# Patient Record
Sex: Female | Born: 1995 | Race: White | Hispanic: No | Marital: Married | Smoking: Never smoker
Health system: Southern US, Community
[De-identification: ages and names within clinical notes are randomized; demographics above are authoritative.]

## PROBLEM LIST (undated history)

## (undated) ENCOUNTER — Inpatient Hospital Stay: Payer: Self-pay

## (undated) ENCOUNTER — Inpatient Hospital Stay: Admission: RE | Payer: Self-pay | Source: Home / Self Care

## (undated) DIAGNOSIS — L709 Acne, unspecified: Secondary | ICD-10-CM

## (undated) DIAGNOSIS — O1490 Unspecified pre-eclampsia, unspecified trimester: Secondary | ICD-10-CM

## (undated) DIAGNOSIS — J309 Allergic rhinitis, unspecified: Secondary | ICD-10-CM

## (undated) DIAGNOSIS — I639 Cerebral infarction, unspecified: Secondary | ICD-10-CM

## (undated) HISTORY — PX: NO PAST SURGERIES: SHX2092

---

## 2018-01-12 NOTE — L&D Delivery Note (Signed)
Delivery Note  Date of delivery: 12/29/2018 Estimated Date of Delivery: 01/15/19 Patient's last menstrual period was 03/29/2018. EGA: [redacted]w[redacted]d  First Stage: Induction/augmentation : misoprostol, pitocin, foley balloon, AROM Analgesia Lorriane Shire intrapartum: IVPM, epidural AROM at [place] presented to L&D with preeclampsia without severe features. She was induced and augmented with misoprostol, pitocin, foley balloon, and AROM. Epidural placed.   Second Stage: Complete dilation at 1957 Onset of pushing at 2008 FHR second stage: category II Delivery at 2038 on 12/29/2018  She progressed to complete and had a spontaneous vaginal birth of a live female over an intact perineum. The fetal head was delivered in direct OA position with restitution to ROA. No nuchal cord. Anterior then posterior shoulders delivered spontaneously. Baby placed on mom's abdomen and attended to by transition RN. Cord clamped and cut when pulseless by father of the baby.   Third Stage: Placenta delivered spontaneously intact with 3VC at 2048 Placenta disposition: routine disposal Uterine tone firm / bleeding brisk from laceration IV pitocin given for hemorrhage prophylaxis  2nd degree perineal and left labial lacerations identified  Anesthesia for repair: epidural Repair: 3-0 Vicryl Rapide CT Est. Blood Loss (mL): 829  Complications: none  Mom to postpartum.  Baby to Couplet care / Skin to Skin.  Newborn: Birth Weight: 5 lb 8.9 oz (2520 g)  Apgar Scores: 9, 9 Feeding planned: breast   Lisette Grinder, CNM 12/29/2018 9:51 PM

## 2018-06-14 LAB — OB RESULTS CONSOLE RUBELLA ANTIBODY, IGM: Rubella: IMMUNE

## 2018-06-18 ENCOUNTER — Encounter: Payer: Self-pay | Admitting: Emergency Medicine

## 2018-06-18 ENCOUNTER — Emergency Department
Admission: EM | Admit: 2018-06-18 | Discharge: 2018-06-19 | Disposition: A | Discharge status: Home / Self Care | Payer: BC Managed Care – PPO | Attending: Emergency Medicine | Admitting: Emergency Medicine

## 2018-06-18 ENCOUNTER — Other Ambulatory Visit: Payer: Self-pay

## 2018-06-18 DIAGNOSIS — R7401 Elevation of levels of liver transaminase levels: Secondary | ICD-10-CM

## 2018-06-18 DIAGNOSIS — R740 Nonspecific elevation of levels of transaminase and lactic acid dehydrogenase [LDH]: Secondary | ICD-10-CM | POA: Diagnosis not present

## 2018-06-18 DIAGNOSIS — K828 Other specified diseases of gallbladder: Secondary | ICD-10-CM

## 2018-06-18 DIAGNOSIS — O219 Vomiting of pregnancy, unspecified: Secondary | ICD-10-CM

## 2018-06-18 DIAGNOSIS — O26611 Liver and biliary tract disorders in pregnancy, first trimester: Secondary | ICD-10-CM | POA: Insufficient documentation

## 2018-06-18 DIAGNOSIS — Z3A09 9 weeks gestation of pregnancy: Secondary | ICD-10-CM | POA: Insufficient documentation

## 2018-06-18 LAB — URINALYSIS, COMPLETE (UACMP) WITH MICROSCOPIC
Bilirubin Urine: NEGATIVE
Glucose, UA: NEGATIVE mg/dL
Hgb urine dipstick: NEGATIVE
Ketones, ur: 80 mg/dL — AB
Nitrite: NEGATIVE
Protein, ur: 30 mg/dL — AB
Specific Gravity, Urine: 1.023 (ref 1.005–1.030)
pH: 7 (ref 5.0–8.0)

## 2018-06-18 LAB — COMPREHENSIVE METABOLIC PANEL
ALT: 135 U/L — ABNORMAL HIGH (ref 0–44)
AST: 75 U/L — ABNORMAL HIGH (ref 15–41)
Albumin: 4.2 g/dL (ref 3.5–5.0)
Alkaline Phosphatase: 58 U/L (ref 38–126)
Anion gap: 11 (ref 5–15)
BUN: 8 mg/dL (ref 6–20)
CO2: 20 mmol/L — ABNORMAL LOW (ref 22–32)
Calcium: 9.1 mg/dL (ref 8.9–10.3)
Chloride: 104 mmol/L (ref 98–111)
Creatinine, Ser: 0.58 mg/dL (ref 0.44–1.00)
GFR calc Af Amer: >60 mL/min (ref >60)
GFR calc non Af Amer: >60 mL/min (ref >60)
Glucose, Bld: 99 mg/dL (ref 70–99)
Potassium: 3.2 mmol/L — ABNORMAL LOW (ref 3.5–5.1)
Sodium: 135 mmol/L (ref 135–145)
Total Bilirubin: 1.7 mg/dL — ABNORMAL HIGH (ref 0.3–1.2)
Total Protein: 8 g/dL (ref 6.5–8.1)

## 2018-06-18 LAB — CBC
HCT: 35.8 % — ABNORMAL LOW (ref 36.0–46.0)
Hemoglobin: 13.4 g/dL (ref 12.0–15.0)
MCH: 29.9 pg (ref 26.0–34.0)
MCHC: 37.4 g/dL — ABNORMAL HIGH (ref 30.0–36.0)
MCV: 79.9 fL — ABNORMAL LOW (ref 80.0–100.0)
Platelets: 299 10*3/uL (ref 150–400)
RBC: 4.48 MIL/uL (ref 3.87–5.11)
RDW: 11.8 % (ref 11.5–15.5)
WBC: 9 10*3/uL (ref 4.0–10.5)
nRBC: 0 % (ref 0.0–0.2)

## 2018-06-18 LAB — LIPASE, BLOOD: Lipase: 55 U/L — ABNORMAL HIGH (ref 11–51)

## 2018-06-18 MED ORDER — SODIUM CHLORIDE 0.9% FLUSH: 3.0000 mL | Freq: Once | INTRAVENOUS | Status: DC

## 2018-06-18 MED ORDER — METOCLOPRAMIDE HCL 5 MG/ML IJ SOLN
10.0000 mg | Freq: Once | INTRAMUSCULAR | Status: AC
  Administered 2018-06-19: 10 mg via INTRAVENOUS
  Filled 2018-06-18: qty 2

## 2018-06-18 MED ORDER — DEXTROSE-NACL 5-0.9 % IV SOLN
INTRAVENOUS | Status: AC
  Administered 2018-06-19: 01:00:00 via INTRAVENOUS

## 2018-06-18 NOTE — ED Notes (Signed)
Patient states that she is feeling worse. Vital signs checked and stable at this time. Patient given update on wait time. Patient verbalizes understanding.

## 2018-06-18 NOTE — ED Provider Notes (Signed)
[facility] Emergency Department Provider Note  ____________________________________________  Time seen: Approximately 11:58 PM  I have reviewed the triage vital signs and the nursing notes.   HISTORY  Chief Complaint Emesis During Pregnancy   HPI Gabriela Davidson is a 23 y.o. female G65, P1 currently at 10.[redacted] weeks gestational age who presents for evaluation of nausea and vomiting.  Patient has had symptoms for 2 days.  More than 10 episodes daily of nonbloody nonbilious emesis.  Unable to keep anything down.  She has been trying promethazine topically to her wrists every 4 hours with no significant help with.  She has had an ultrasound confirming this pregnancy already with her OB/GYN.  She has no vaginal bleeding, no vaginal discharge, no dysuria, no diarrhea, no abdominal pain, no fever, no cough, no chest pain, no shortness of breath.  PMH None - reviewed  Allergies Patient has no known allergies.  FH Arthritis Father    High blood pressure (Hypertension) Father    Hyperlipidemia (Elevated cholesterol) Father    Lung cancer Maternal Grandfather    Breast cancer Maternal Grandmother      Social History Social History   Tobacco Use  . Smoking status: Never Smoker  . Smokeless tobacco: Never Used  Substance Use Topics  . Alcohol use: Not on file  . Drug use: Not on file    Review of Systems  Constitutional: Negative for fever. Eyes: Negative for visual changes. ENT: Negative for sore throat. Neck: No neck pain  Cardiovascular: Negative for chest pain. Respiratory: Negative for shortness of breath. Gastrointestinal: Negative for abdominal pain or diarrhea. + N/V Genitourinary: Negative for dysuria. Musculoskeletal: Negative for back pain. Skin: Negative for rash. Neurological: Negative for headaches, weakness or numbness. Psych: No SI or HI  ____________________________________________   PHYSICAL EXAM:  VITAL SIGNS: ED  Triage Vitals  Enc Vitals Group     BP 06/18/18 1816 125/81     Pulse Rate 06/18/18 1816 92     Resp 06/18/18 1816 16     Temp 06/18/18 1816 99.2 F (37.3 C)     Temp Source 06/18/18 1816 Oral     SpO2 06/18/18 1816 99 %     Weight 06/18/18 1813 138 lb (62.6 kg)     Height --      Head Circumference --      Peak Flow --      Pain Score 06/18/18 1813 0     Pain Loc --      Pain Edu? --      Excl. in [place]? --     Constitutional: Alert and oriented. Well appearing and in no apparent distress. HEENT:      Head: Normocephalic and atraumatic.         Eyes: Conjunctivae are normal. Sclera is non-icteric.       Mouth/Throat: Mucous membranes are moist.       Neck: Supple with no signs of meningismus. Cardiovascular: Regular rate and rhythm. No murmurs, gallops, or rubs. 2+ symmetrical distal pulses are present in all extremities. No JVD. Respiratory: Normal respiratory effort. Lungs are clear to auscultation bilaterally. No wheezes, crackles, or rhonchi.  Gastrointestinal: Soft, non tender, and non distended with positive bowel sounds. No rebound or guarding. Musculoskeletal: Nontender with normal range of motion in all extremities. No edema, cyanosis, or erythema of extremities. Neurologic: Normal speech and language. Face is symmetric. Moving all extremities. No gross focal neurologic deficits are appreciated. Skin: Skin is warm, dry and  intact. No rash noted. Psychiatric: Mood and affect are normal. Speech and behavior are normal.  ____________________________________________   LABS (all labs ordered are listed, but only abnormal results are displayed)  Labs Reviewed  LIPASE, BLOOD - Abnormal; Notable for the following components:      Result Value   Lipase 55 (*)    All other components within normal limits  COMPREHENSIVE METABOLIC PANEL - Abnormal; Notable for the following components:   Potassium 3.2 (*)    CO2 20 (*)    AST 75 (*)    ALT 135 (*)    Total Bilirubin 1.7 (*)     All other components within normal limits  CBC - Abnormal; Notable for the following components:   HCT 35.8 (*)    MCV 79.9 (*)    MCHC 37.4 (*)    All other components within normal limits  URINALYSIS, COMPLETE (UACMP) WITH MICROSCOPIC - Abnormal; Notable for the following components:   Color, Urine AMBER (*)    APPearance CLOUDY (*)    Ketones, ur 80 (*)    Protein, ur 30 (*)    Leukocytes,Ua SMALL (*)    Bacteria, UA RARE (*)    All other components within normal limits  HCG, QUANTITATIVE, PREGNANCY - Abnormal; Notable for the following components:   hCG, Beta Chain, Quant, S 171,175 (*)    All other components within normal limits  URINE CULTURE   ____________________________________________  EKG  none  ____________________________________________  RADIOLOGY  I have personally reviewed the images performed during this visit and I agree with the Radiologist's read.   Interpretation by Radiologist:  [place] Abdomen Limited Ruq  Result Date: 06/19/2018 CLINICAL DATA:  23 year old female with elevated liver and pancreatic enzymes. EXAM: ULTRASOUND ABDOMEN LIMITED RIGHT UPPER QUADRANT COMPARISON:  None. FINDINGS: Gallbladder: There is a large amount of sludge within the gallbladder. No gallbladder wall thickening or pericholecystic fluid. Negative sonographic Murphy's sign. Common bile duct: Diameter: 3 mm Liver: No focal lesion identified. Within normal limits in parenchymal echogenicity. Portal vein is patent on color Doppler imaging with normal direction of blood flow towards the liver. IMPRESSION: Gallbladder sludge.  No sonographic evidence of acute cholecystitis. Electronically Signed   By: Elgie CollardArash  Radparvar M.D.   On: 06/19/2018 00:57      ____________________________________________   PROCEDURES  Procedure(s) performed: None Procedures Critical Care performed:  None ____________________________________________   INITIAL IMPRESSION / ASSESSMENT AND PLAN / ED COURSE   23 y.o. female G0, P1 currently at 309.[redacted] weeks gestational age who presents for evaluation of nausea and vomiting x 2 days.  Patient is well-appearing in no distress with normal vital signs, abdomen is soft with no tenderness throughout.  Labs showed mildly elevated LFTs, lipase, T bili.  Review of labs done 2 weeks ago showed mildly elevated LFTs.  Patient has no abdominal pain or tenderness on exam.  We will do a right upper quadrant ultrasound to rule out cholelithiasis/cholecystitis.  No significant signs of dehydration however patient does have 80 ketones in her urine.  Will give D5 normal bolus of 1 L and Reglan for nausea.     _________________________ 1:50 AM on 06/19/2018 -----------------------------------------  Ultrasound showing sludge with no evidence of cholecystitis.  After receiving fluids and Reglan patient feels great, tolerating p.o. with no further episodes of vomiting.  Discussed findings of ultrasound with patient and recommended follow-up with surgery as an outpatient.  Patient was given a prescription for Reglan for nausea and vomiting and recommended close  follow-up with her OB/GYN.  Discussed return precautions for abdominal pain or fever.   As part of my medical decision making, I reviewed the following data within the electronic MEDICAL RECORD NUMBER Nursing notes reviewed and incorporated, Labs reviewed , Old chart reviewed, Radiograph reviewed , Notes from prior ED visits and [place] Controlled Substance Database    Pertinent labs & imaging results that were available during my care of the patient were reviewed by me and considered in my medical decision making (see chart for details).    ____________________________________________   FINAL CLINICAL IMPRESSION(S) / ED DIAGNOSES  Final diagnoses:  Transaminitis  Gallbladder sludge  Nausea and vomiting during pregnancy      NEW MEDICATIONS STARTED DURING THIS VISIT:  ED Discharge Orders         Ordered     metoCLOPramide (REGLAN) 10 MG tablet  Every 8 hours PRN     06/19/18 0150           Note:  This document was prepared using Dragon voice recognition software and may include unintentional dictation errors.    Don PerkingVeronese, [place], MD 06/19/18 [phone]

## 2018-06-18 NOTE — ED Triage Notes (Addendum)
Arrives with c/o emesis x 2 days.  Symptoms worsening over past two days.  Patient is 9.[redacted] weeks pregnant.  EDC:  01/15/2019.  G:1 P: 0.  Patient has been taking 10 prometh 25mg /0.4 ml topically to wrist Q 4 hours.  Has helped with nausea.  Patient is AAOx3.  Skin warm and dry. NAD

## 2018-06-19 ENCOUNTER — Emergency Department: Payer: BC Managed Care – PPO

## 2018-06-19 LAB — HCG, QUANTITATIVE, PREGNANCY: hCG, Beta Chain, Quant, S: 171175 m[IU]/mL — ABNORMAL HIGH (ref <5)

## 2018-06-19 MED ORDER — METOCLOPRAMIDE HCL 10 MG PO TABS: 10.0000 mg | ORAL_TABLET | Freq: Three times a day (TID) | ORAL | 0 refills | Status: DC | PRN

## 2018-06-19 NOTE — ED Notes (Signed)
Peripheral IV discontinued. Catheter intact. No signs of infiltration or redness. Gauze applied to IV site.  Discharge instructions reviewed with patient. Questions fielded by this RN. Patient verbalizes understanding of instructions. Patient discharged home in stable condition per veronese. No acute distress noted at time of discharge.   

## 2018-06-19 NOTE — ED Notes (Signed)
Fluids orders not available, pharmacy called, pt already assessed by Dr V, pt c/o 9 weeks preg and continuous N/V, vomiting  More than 10 today

## 2018-06-19 NOTE — ED Notes (Signed)
Patient transported to Ultrasound 

## 2018-06-21 LAB — OB RESULTS CONSOLE GC/CHLAMYDIA
Chlamydia: NEGATIVE
Gonorrhea: NEGATIVE

## 2018-06-21 LAB — URINE CULTURE: Culture: >=100000 — AB

## 2018-06-21 LAB — OB RESULTS CONSOLE HEPATITIS B SURFACE ANTIGEN: Hepatitis B Surface Ag: NEGATIVE

## 2018-06-21 LAB — OB RESULTS CONSOLE VARICELLA ZOSTER ANTIBODY, IGG: Varicella: IMMUNE

## 2018-07-15 DIAGNOSIS — O210 Mild hyperemesis gravidarum: Secondary | ICD-10-CM | POA: Insufficient documentation

## 2018-07-18 ENCOUNTER — Emergency Department
Admission: EM | Admit: 2018-07-18 | Discharge: 2018-07-18 | Discharge status: Against Medical Advice | Payer: BC Managed Care – PPO

## 2018-12-21 LAB — OB RESULTS CONSOLE GBS: GBS: NEGATIVE

## 2018-12-28 ENCOUNTER — Encounter: Payer: Self-pay | Admitting: *Deleted

## 2018-12-28 ENCOUNTER — Other Ambulatory Visit: Payer: Self-pay

## 2018-12-28 ENCOUNTER — Inpatient Hospital Stay
Admission: EM | Admit: 2018-12-28 | Discharge: 2018-12-31 | DRG: 806 | Disposition: A | Discharge status: Home / Self Care | Payer: BC Managed Care – PPO | Attending: Certified Nurse Midwife | Admitting: Certified Nurse Midwife

## 2018-12-28 DIAGNOSIS — O1404 Mild to moderate pre-eclampsia, complicating childbirth: Principal | ICD-10-CM | POA: Diagnosis present

## 2018-12-28 DIAGNOSIS — Z20828 Contact with and (suspected) exposure to other viral communicable diseases: Secondary | ICD-10-CM | POA: Diagnosis present

## 2018-12-28 DIAGNOSIS — O133 Gestational [pregnancy-induced] hypertension without significant proteinuria, third trimester: Secondary | ICD-10-CM | POA: Diagnosis present

## 2018-12-28 DIAGNOSIS — Z3A37 37 weeks gestation of pregnancy: Secondary | ICD-10-CM | POA: Diagnosis not present

## 2018-12-28 DIAGNOSIS — O9081 Anemia of the puerperium: Secondary | ICD-10-CM | POA: Diagnosis not present

## 2018-12-28 DIAGNOSIS — D62 Acute posthemorrhagic anemia: Secondary | ICD-10-CM | POA: Diagnosis not present

## 2018-12-28 DIAGNOSIS — Z23 Encounter for immunization: Secondary | ICD-10-CM | POA: Diagnosis not present

## 2018-12-28 DIAGNOSIS — O368130 Decreased fetal movements, third trimester, not applicable or unspecified: Secondary | ICD-10-CM | POA: Diagnosis present

## 2018-12-28 HISTORY — DX: Gestational (pregnancy-induced) hypertension without significant proteinuria, third trimester: O13.3

## 2018-12-28 LAB — COMPREHENSIVE METABOLIC PANEL
ALT: 12 U/L (ref 0–44)
AST: 20 U/L (ref 15–41)
Albumin: 2.9 g/dL — ABNORMAL LOW (ref 3.5–5.0)
Alkaline Phosphatase: 120 U/L (ref 38–126)
Anion gap: 8 (ref 5–15)
BUN: 9 mg/dL (ref 6–20)
CO2: 22 mmol/L (ref 22–32)
Calcium: 8.9 mg/dL (ref 8.9–10.3)
Chloride: 108 mmol/L (ref 98–111)
Creatinine, Ser: 0.61 mg/dL (ref 0.44–1.00)
GFR calc Af Amer: >60 mL/min (ref >60)
GFR calc non Af Amer: >60 mL/min (ref >60)
Glucose, Bld: 83 mg/dL (ref 70–99)
Potassium: 4.4 mmol/L (ref 3.5–5.1)
Sodium: 138 mmol/L (ref 135–145)
Total Bilirubin: 0.4 mg/dL (ref 0.3–1.2)
Total Protein: 6.7 g/dL (ref 6.5–8.1)

## 2018-12-28 LAB — CBC
HCT: 31.9 % — ABNORMAL LOW (ref 36.0–46.0)
Hemoglobin: 11 g/dL — ABNORMAL LOW (ref 12.0–15.0)
MCH: 30 pg (ref 26.0–34.0)
MCHC: 34.5 g/dL (ref 30.0–36.0)
MCV: 86.9 fL (ref 80.0–100.0)
Platelets: 273 10*3/uL (ref 150–400)
RBC: 3.67 MIL/uL — ABNORMAL LOW (ref 3.87–5.11)
RDW: 13.2 % (ref 11.5–15.5)
WBC: 11.1 10*3/uL — ABNORMAL HIGH (ref 4.0–10.5)
nRBC: 0 % (ref 0.0–0.2)

## 2018-12-28 LAB — RAPID HIV SCREEN (HIV 1/2 AB+AG)
HIV 1/2 Antibodies: NONREACTIVE
HIV-1 P24 Antigen - HIV24: NONREACTIVE

## 2018-12-28 LAB — RESPIRATORY PANEL BY RT PCR (FLU A&B, COVID)
Influenza A by PCR: NEGATIVE
Influenza B by PCR: NEGATIVE
SARS Coronavirus 2 by RT PCR: NEGATIVE

## 2018-12-28 LAB — TYPE AND SCREEN
ABO/RH(D): B POS
Antibody Screen: NEGATIVE

## 2018-12-28 LAB — PROTEIN / CREATININE RATIO, URINE
Creatinine, Urine: 35 mg/dL
Protein Creatinine Ratio: 0.51 mg/mg{Cre} — ABNORMAL HIGH (ref 0.00–0.15)
Total Protein, Urine: 18 mg/dL

## 2018-12-28 LAB — ABO/RH: ABO/RH(D): B POS

## 2018-12-28 LAB — OB RESULTS CONSOLE HIV ANTIBODY (ROUTINE TESTING): HIV: NONREACTIVE

## 2018-12-28 MED ORDER — TERBUTALINE SULFATE 1 MG/ML IJ SOLN: 0.2500 mg | Freq: Once | INTRAMUSCULAR | Status: DC | PRN

## 2018-12-28 MED ORDER — MISOPROSTOL 25 MCG QUARTER TABLET
25.0000 ug | ORAL_TABLET | ORAL | Status: DC | PRN
  Filled 2018-12-28: qty 1

## 2018-12-28 MED ORDER — BUTORPHANOL TARTRATE 1 MG/ML IJ SOLN
1.0000 mg | INTRAMUSCULAR | Status: DC | PRN
  Administered 2018-12-29 (×2): 1 mg via INTRAVENOUS
  Filled 2018-12-28 (×2): qty 1

## 2018-12-28 MED ORDER — MISOPROSTOL 25 MCG QUARTER TABLET
25.0000 ug | ORAL_TABLET | Freq: Once | ORAL | Status: AC
  Administered 2018-12-28: 25 ug via BUCCAL
  Filled 2018-12-28: qty 1

## 2018-12-28 MED ORDER — LACTATED RINGERS IV SOLN
500.0000 mL | INTRAVENOUS | Status: DC | PRN
  Administered 2018-12-29: 250 mL via INTRAVENOUS

## 2018-12-28 MED ORDER — HYDRALAZINE HCL 20 MG/ML IJ SOLN: 10.0000 mg | INTRAMUSCULAR | Status: DC | PRN

## 2018-12-28 MED ORDER — OXYTOCIN BOLUS FROM INFUSION
500.0000 mL | Freq: Once | INTRAVENOUS | Status: AC
  Administered 2018-12-29: 500 mL via INTRAVENOUS

## 2018-12-28 MED ORDER — OXYTOCIN 40 UNITS IN NORMAL SALINE INFUSION - SIMPLE MED
1.0000 m[IU]/min | INTRAVENOUS | Status: DC
  Administered 2018-12-29: 6 m[IU]/min via INTRAVENOUS
  Administered 2018-12-29: 1 m[IU]/min via INTRAVENOUS

## 2018-12-28 MED ORDER — LABETALOL HCL 5 MG/ML IV SOLN: 20.0000 mg | INTRAVENOUS | Status: DC | PRN

## 2018-12-28 MED ORDER — LABETALOL HCL 5 MG/ML IV SOLN: 40.0000 mg | INTRAVENOUS | Status: DC | PRN

## 2018-12-28 MED ORDER — OXYTOCIN 10 UNIT/ML IJ SOLN
INTRAMUSCULAR | Status: AC
  Filled 2018-12-28: qty 2

## 2018-12-28 MED ORDER — MISOPROSTOL 200 MCG PO TABS
ORAL_TABLET | ORAL | Status: AC
  Administered 2018-12-28: 25 ug via VAGINAL
  Filled 2018-12-28: qty 4

## 2018-12-28 MED ORDER — HYDRALAZINE HCL 20 MG/ML IJ SOLN: 5.0000 mg | INTRAMUSCULAR | Status: DC | PRN

## 2018-12-28 MED ORDER — ACETAMINOPHEN 325 MG PO TABS: 650.0000 mg | ORAL_TABLET | ORAL | Status: DC | PRN

## 2018-12-28 MED ORDER — LIDOCAINE HCL (PF) 1 % IJ SOLN
INTRAMUSCULAR | Status: AC
  Filled 2018-12-28: qty 30

## 2018-12-28 MED ORDER — SOD CITRATE-CITRIC ACID 500-334 MG/5ML PO SOLN: 30.0000 mL | ORAL | Status: DC | PRN

## 2018-12-28 MED ORDER — LACTATED RINGERS IV SOLN: INTRAVENOUS | Status: DC

## 2018-12-28 MED ORDER — LIDOCAINE HCL (PF) 1 % IJ SOLN: 30.0000 mL | INTRAMUSCULAR | Status: DC | PRN

## 2018-12-28 MED ORDER — AMMONIA AROMATIC IN INHA
RESPIRATORY_TRACT | Status: AC
  Filled 2018-12-28: qty 10

## 2018-12-28 MED ORDER — OXYTOCIN 40 UNITS IN NORMAL SALINE INFUSION - SIMPLE MED
2.5000 [IU]/h | INTRAVENOUS | Status: DC
  Administered 2018-12-30: 2.5 [IU]/h via INTRAVENOUS
  Filled 2018-12-28 (×2): qty 1000

## 2018-12-28 MED ORDER — ONDANSETRON HCL 4 MG/2ML IJ SOLN
4.0000 mg | Freq: Four times a day (QID) | INTRAMUSCULAR | Status: DC | PRN
  Administered 2018-12-29: 4 mg via INTRAVENOUS
  Filled 2018-12-28: qty 2

## 2018-12-28 NOTE — Progress Notes (Signed)
Gabriela Davidson is a 23 y.o. G1P0 at [redacted]w[redacted]d admitted for induction of labor due to preE without severe features. - P:C 500 - misoprostol given 2142pm, 25 buccal, 25 vaginal  Subjective: Cramping  Objective: BP (!) 148/88   Pulse 74   Temp 98 F (36.7 C) (Oral)   Resp 18   Ht 5\' 1"  (1.549 m)   Wt 83 kg   BMI 34.58 kg/m  No intake/output data recorded. No intake/output data recorded.  FHT:  FHR: 135 bpm, variability: moderate,  accelerations:  Present,  decelerations:  Absent UC:   regular, every 2 minutes SVE:   Dilation: 1 Effacement (%): 50, 60 Station: [place], -3 Exam by:: A. White, RN  Labs: Lab Results  Component Value Date   WBC 11.1 (H) 12/28/2018   HGB 11.0 (L) 12/28/2018   HCT 31.9 (L) 12/28/2018   MCV 86.9 12/28/2018   PLT 273 12/28/2018    Assessment / Plan: Induction of labor due to preeclampsia,  progressing well on pitocin  Labor: continue cervical ripening Preeclampsia:  no new signs Fetal Wellbeing:  Category I   Benjaman Kindler 12/28/2018, 11:34 PM

## 2018-12-28 NOTE — OB Triage Note (Signed)
Pt sent over from office for BP evaluation. Patient denies headache or epigastric pain but states she has had visual spots and has some edema in lower extremities.  EFM applied and explained. Serial BPs started.

## 2018-12-28 NOTE — Progress Notes (Addendum)
TRIAGE VISIT with NST   Gabriela Davidson is a 23 y.o. G1P0. She is at [redacted]w[redacted]d gestation.  Indication: Elevated Blood Pressure to 170s over 110 in the office, with decreased fetal movement but no headache  S: Resting comfortably. no CTX, no VB. Active fetal movement. Denies headache, SOB, RUQ pain. + visual changes, +new onset edema O:  BP (!) 151/92   Pulse 83   Temp 98 F (36.7 C) (Oral)   Resp 18   Ht 5\' 1"  (1.549 m)   Wt 83 kg   BMI 34.58 kg/m  No results found for this or any previous visit (from the past 48 hour(s)).   Gen: NAD, AAOx3      Abd: FNTTP      Ext: Non-tender, pitting edema to knees Reflexes: 1+  NST:  Baseline: 125 Variability: Moderate Accelerations: 15x15 Decelerations: None  TOCO: quiet SVE:  1/60/-3/soft/posterior  NST: Category I strip, see detailed evaluation above  A/P:  23 y.o. G1P0 [redacted]w[redacted]d with elevated BP and decreased fetal movement.                        Labor: not present.   R/o PreEclampsia: labs pending  Fetal Wellbeing: continuous monitoring

## 2018-12-28 NOTE — H&P (Signed)
OB ADMISSION/ HISTORY & PHYSICAL:  Admission Date: 12/28/2018  4:42 PM  Admit Diagnosis: PreEclampsia without severe features  Gabriela Davidson is a 23 y.o. G1P0 presenting for elevated Blood pressure and proteinuria.  Prenatal History: G1P0   EDC : 01/15/2019 Prenatal care at [facility]  Prenatal course complicated by: 1. Hyperemesis with transaminitis  LFTs elevated 6/6 in ER; 14lbs wt loss at NOB  Rx Phenergan gel topical, Reglan, B6 and Zofran.   6/6 RUQ [place] in ER: gallbladder sludge, no stones.  8/4: repeat CMP- WNL 2. Anemia  hgb 10.3 at 30w  hgb 11.3 at 36w  Add iron supplement - pt stopped taking Fe 3. Elevated BP 12/28/2018, Pitting Edema, Visual Changes  139/99 on automatic, retake with manual was 162/100 on different arm, 3rd time by provider: 172/110   Medical / Surgical History :  Past medical history: History reviewed. No pertinent past medical history.   Past surgical history: History reviewed. No pertinent surgical history.  Family History: History reviewed. No pertinent family history.   Social History:  reports that she has never smoked. She has never used smokeless tobacco. She reports that she does not drink alcohol or use drugs.   Allergies: Patient has no known allergies.    Current Medications at time of admission:  Prior to Admission medications   Medication Sig Start Date End Date Taking? Authorizing Provider  metoCLOPramide (REGLAN) 10 MG tablet Take 1 tablet (10 mg total) by mouth every 8 (eight) hours as needed for up to 3 days for nausea. 06/19/18 06/22/18  Rudene Re, MD     Review of Systems: Active FM No SOB, headache +visual spots, +lower ex edema  Physical Exam:  VS: Blood pressure (!) 144/91, pulse 93, temperature 98 F (36.7 C), temperature source Oral, resp. rate 18, height 5\' 1"  (1.549 m), weight 83 kg.  General: alert and oriented, appears NAD Heart: RRR Lungs: Clear lung fields x6 Abdomen: Gravid, soft and non-tender,  non-distended Extremities: 3+ edema Reflexes: +1 bilaterally  FHT: 125, moderate variability, +accels, no decels TOCO: irregular SVE: 1/60/high/soft/posterior   /   /    Cephalic by leopolds  Prenatal Labs: Blood type/Rh --/--/PENDING (12/16 1727)  Antibody screen neg  Rubella Immune  Varicella Immune  RPR NR  HBsAg Neg  HIV NR  GC neg  Chlamydia neg  Genetic screening negative  1 hour GTT 107  3 hour GTT n/a  GBS neg   No results found.  Assessment: [redacted]w[redacted]d weeks gestation PreE without severe features, on BP and proteinuria FHR category 1   Plan:   Admit for induction of labor Mag and iv antihypertensives per protocol PRN Epidural when desired Continuous fetal monitoring   1. Fetal Well being  - Fetal Tracing: Cat I - Ultrasound:  reviewed, as above - Group B Streptococcus: neg - Presentation: vtx confirmed by Leopolds   2. Routine OB: - Prenatal labs reviewed, as above - Rh B positive  3. Post Partum Planning: - Infant feeding: breast - Contraception: undecided  4. Induction of Labor:  -  Contractions external toco in place -  Plan for induction with cytotec   5. PreE - antihypertensives PRN - mag if severe features arise - continuous monitoring - BP check hourly

## 2018-12-28 NOTE — Progress Notes (Signed)
Labs drawn, urine sent

## 2018-12-29 ENCOUNTER — Inpatient Hospital Stay: Payer: BC Managed Care – PPO | Admitting: Anesthesiology

## 2018-12-29 ENCOUNTER — Encounter: Payer: Self-pay | Admitting: Obstetrics and Gynecology

## 2018-12-29 LAB — CBC
HCT: 32.3 % — ABNORMAL LOW (ref 36.0–46.0)
Hemoglobin: 11.2 g/dL — ABNORMAL LOW (ref 12.0–15.0)
MCH: 30.3 pg (ref 26.0–34.0)
MCHC: 34.7 g/dL (ref 30.0–36.0)
MCV: 87.3 fL (ref 80.0–100.0)
Platelets: 251 10*3/uL (ref 150–400)
RBC: 3.7 MIL/uL — ABNORMAL LOW (ref 3.87–5.11)
RDW: 13.2 % (ref 11.5–15.5)
WBC: 12.5 10*3/uL — ABNORMAL HIGH (ref 4.0–10.5)
nRBC: 0 % (ref 0.0–0.2)

## 2018-12-29 LAB — RPR: RPR Ser Ql: NONREACTIVE

## 2018-12-29 MED ORDER — FERROUS SULFATE 325 (65 FE) MG PO TABS
325.0000 mg | ORAL_TABLET | Freq: Every day | ORAL | Status: DC
  Administered 2018-12-30: 325 mg via ORAL
  Filled 2018-12-29: qty 1

## 2018-12-29 MED ORDER — INFLUENZA VAC SPLIT QUAD 0.5 ML IM SUSY
0.5000 mL | PREFILLED_SYRINGE | INTRAMUSCULAR | Status: AC
  Administered 2018-12-31: 0.5 mL via INTRAMUSCULAR
  Filled 2018-12-29 (×2): qty 0.5

## 2018-12-29 MED ORDER — FENTANYL 2.5 MCG/ML W/ROPIVACAINE 0.15% IN NS 100 ML EPIDURAL (ARMC)
12.0000 mL/h | EPIDURAL | Status: DC
  Administered 2018-12-29: 12 mL/h via EPIDURAL

## 2018-12-29 MED ORDER — LIDOCAINE HCL (PF) 1 % IJ SOLN
INTRAMUSCULAR | Status: DC | PRN
  Administered 2018-12-29: 3 mL via INTRADERMAL

## 2018-12-29 MED ORDER — FENTANYL 2.5 MCG/ML W/ROPIVACAINE 0.15% IN NS 100 ML EPIDURAL (ARMC)
EPIDURAL | Status: AC
  Filled 2018-12-29: qty 100

## 2018-12-29 MED ORDER — PHENYLEPHRINE 40 MCG/ML (10ML) SYRINGE FOR IV PUSH (FOR BLOOD PRESSURE SUPPORT): 80.0000 ug | PREFILLED_SYRINGE | INTRAVENOUS | Status: DC | PRN

## 2018-12-29 MED ORDER — ACETAMINOPHEN 325 MG PO TABS: 650.0000 mg | ORAL_TABLET | ORAL | Status: DC | PRN

## 2018-12-29 MED ORDER — EPHEDRINE 5 MG/ML INJ: 10.0000 mg | INTRAVENOUS | Status: DC | PRN

## 2018-12-29 MED ORDER — BUPIVACAINE HCL (PF) 0.25 % IJ SOLN
INTRAMUSCULAR | Status: DC | PRN
  Administered 2018-12-29: 5 mL via EPIDURAL
  Administered 2018-12-29: 3 mL via EPIDURAL

## 2018-12-29 MED ORDER — DIPHENHYDRAMINE HCL 50 MG/ML IJ SOLN: 12.5000 mg | INTRAMUSCULAR | Status: DC | PRN

## 2018-12-29 MED ORDER — BENZOCAINE-MENTHOL 20-0.5 % EX AERO
1.0000 "application " | INHALATION_SPRAY | CUTANEOUS | Status: DC | PRN
  Administered 2018-12-30: 1 via TOPICAL
  Filled 2018-12-29 (×2): qty 56

## 2018-12-29 MED ORDER — IBUPROFEN 600 MG PO TABS
600.0000 mg | ORAL_TABLET | Freq: Four times a day (QID) | ORAL | Status: DC
  Administered 2018-12-30 – 2018-12-31 (×7): 600 mg via ORAL
  Filled 2018-12-29 (×7): qty 1

## 2018-12-29 MED ORDER — LACTATED RINGERS IV SOLN
500.0000 mL | Freq: Once | INTRAVENOUS | Status: AC
  Administered 2018-12-29: 500 mL via INTRAVENOUS

## 2018-12-29 MED ORDER — LIDOCAINE-EPINEPHRINE (PF) 1.5 %-1:200000 IJ SOLN
INTRAMUSCULAR | Status: DC | PRN
  Administered 2018-12-29: 3 mL via EPIDURAL

## 2018-12-29 NOTE — Anesthesia Procedure Notes (Signed)
Epidural Patient location during procedure: OB  Staffing Anesthesiologist: Piscitello, Joseph K, MD Resident/CRNA: Jaimere Feutz, CRNA Performed: resident/CRNA   Preanesthetic Checklist Completed: patient identified, IV checked, site marked, risks and benefits discussed, surgical consent, monitors and equipment checked, pre-op evaluation and timeout performed  Epidural Patient position: sitting Prep: ChloraPrep and site prepped and draped Patient monitoring: heart rate, continuous pulse ox and blood pressure Approach: midline Location: L4-L5 Injection technique: LOR saline  Needle:  Needle type: Tuohy  Needle gauge: 17 G Needle length: 9 cm and 9 Needle insertion depth: 7 cm Catheter type: closed end flexible Catheter size: 19 Gauge Catheter at skin depth: 12 cm Test dose: negative and 1.5% lidocaine with Epi 1:200 K  Assessment Events: blood not aspirated, injection not painful, no injection resistance, no paresthesia and negative IV test  Additional Notes   Patient tolerated the insertion well without complications.Reason for block:procedure for pain     

## 2018-12-29 NOTE — Progress Notes (Signed)
Labor Progress Note  Gabriela Davidson is a 23 y.o. G1P0 at [redacted]w[redacted]d by ultrasound admitted for induction of labor due to preeclampsia without severe features.  Subjective:  Comfortable with epidural in place.    Objective: BP (!) 144/85 (BP Location: Left Arm)   Pulse 89   Temp 98.5 F (36.9 C) (Oral)   Resp 17   Ht 5\' 1"  (1.549 m)   Wt 83 kg   SpO2 99%   BMI 34.58 kg/m   Fetal Assessment: FHT:  FHR: 130-140bpm, variability: moderate,  accelerations:  Present,  decelerations:  Present variable Category/reactivity:  Category II UC:   regular, every 1.5-3 minutes SVE:    Dilation: 4cm  Effacement: 70%  Station:  -1  Consistency: soft  Position: middle  Membrane status: AROM 1743 Amniotic color: scant, blood tinged  Labs: Lab Results  Component Value Date   WBC 12.5 (H) 12/29/2018   HGB 11.2 (L) 12/29/2018   HCT 32.3 (L) 12/29/2018   MCV 87.3 12/29/2018   PLT 251 12/29/2018    Assessment / Plan: Induction of labor due to preeclampsia without severe features, latent labor  Labor: Pitocin infusing at 92mu/min, continue to titrate to adequate MVUs; s/p foley balloon; IUPC placed now Preeclampsia:  labs stable and no evidence of severe features, normal to mild range BPs Fetal Wellbeing:  Category II for occasional variable, overall reassurring Pain Control:  Epidural I/D:  n/a Anticipated MOD:  NSVD  Lisette Grinder, CNM 12/29/2018, 5:49 PM

## 2018-12-29 NOTE — Anesthesia Preprocedure Evaluation (Signed)
Anesthesia Evaluation  Patient identified by MRN, date of birth, ID band Patient awake    Reviewed: Allergy & Precautions, H&P , NPO status , Patient's Chart, lab work & pertinent test results  Airway Mallampati: II  TM Distance: >3 FB Neck ROM: full    Dental  (+) Dental Advidsory Given   Pulmonary neg pulmonary ROS,           Cardiovascular hypertension,      Neuro/Psych  Headaches, negative psych ROS   GI/Hepatic Neg liver ROS, GERD  ,  Endo/Other  negative endocrine ROS  Renal/GU negative Renal ROS  negative genitourinary   Musculoskeletal   Abdominal   Peds  Hematology negative hematology ROS (+)   Anesthesia Other Findings   Reproductive/Obstetrics (+) Pregnancy                             Anesthesia Physical Anesthesia Plan  ASA: II  Anesthesia Plan: Epidural   Post-op Pain Management:    Induction:   PONV Risk Score and Plan:   Airway Management Planned:   Additional Equipment:   Intra-op Plan:   Post-operative Plan:   Informed Consent: I have reviewed the patients History and Physical, chart, labs and discussed the procedure including the risks, benefits and alternatives for the proposed anesthesia with the patient or authorized representative who has indicated his/her understanding and acceptance.       Plan Discussed with: CRNA  Anesthesia Plan Comments:         Anesthesia Quick Evaluation

## 2018-12-29 NOTE — Progress Notes (Signed)
Labor Progress Note  Gabriela Davidson is a 23 y.o. G1P0 at [redacted]w[redacted]d by ultrasound admitted for induction of labor due to preeclampsia without severe features.  Subjective:  Feeling contractions, starting to get stronger. Still comfortable overall.   Objective: BP 130/75   Pulse 72   Temp 98.6 F (37 C) (Oral)   Resp 18   Ht 5\' 1"  (1.549 m)   Wt 83 kg   BMI 34.58 kg/m   Fetal Assessment: FHT:  FHR: 135 bpm, variability: moderate,  accelerations:  Present,  decelerations:  Present variable Category/reactivity:  Category II UC:   irregular, every 1-4 minutes SVE:    Dilation: 1cm  Effacement: 50%  Station:  -3  Consistency: medium  Position: middle  Membrane status: intact Amniotic color: n/a  Labs: Lab Results  Component Value Date   WBC 11.1 (H) 12/28/2018   HGB 11.0 (L) 12/28/2018   HCT 31.9 (L) 12/28/2018   MCV 86.9 12/28/2018   PLT 273 12/28/2018    Assessment / Plan: Induction of labor due to preeclampsia without severe features, latent labor  Labor: Pitocin infusing at 66mu/min, continue to titrate per protocol to adequate contraction pattern Preeclampsia:  labs stable and no evidence of severe features Fetal Wellbeing:  Category II for occasional variable, overall reassurring Pain Control:  Labor support without medications at this time, plans epidural at some poitn I/D:  n/a Anticipated MOD:  NSVD  Lisette Grinder, CNM 12/29/2018, 8:26 AM

## 2018-12-29 NOTE — Discharge Summary (Signed)
Obstetric Discharge Summary   Patient Name: Gabriela Davidson DOB: Jan 21, 1995 MRN: [id number]  Date of Admission: 12/28/2018 Date of Delivery: 12/29/2018 Delivered by: Genia Del, CNM Date of Discharge: 12/31/2018  Primary OB: [facility]  [id number]'T last menstrual period was 03/29/2018. EDC Estimated Date of Delivery: 01/15/19 Gestational Age at Delivery: [redacted]w[redacted]d   Antepartum complications:  1. Hyperemesis with transaminitis 2. Anemia on oral iron supplement 3. Preeclampsia without severe features  Admitting Diagnosis: IOL for preeclampsia without severe features  Secondary Diagnoses: Patient Active Problem List   Diagnosis Date Noted  . Gestational hypertension, third trimester 12/28/2018    Induction/Augmentation: AROM, Pitocin, Cytotec and Foley Balloon Complications: None Intrapartum complications/course: She presented to L&D with preeclampsia without severe features. She was induced and augmented with misoprostol, pitocin, foley balloon, and AROM. Epidural placed. She progressed to complete and had a spontaneous vaginal birth of a live female over an intact perineum. The fetal head was delivered in direct OA position with restitution to ROA. No nuchal cord. Anterior then posterior shoulders delivered spontaneously. Baby placed on mom's abdomen and attended to by transition RN. Cord clamped and cut when pulseless by father of the baby. Placenta delivered spontaneously intact with 3VC. Uterine tone firm / bleeding brisk from laceration. IV pitocin given for hemorrhage prophylaxis. 2nd degree perineal and left labial lacerations identified. 2nd degree repaired in the usual fashion with good hemostasis. EBL . Delivery Type: spontaneous vaginal delivery Anesthesia: epidural Placenta: spontaneous Laceration: 2nd degree perineal, left labial Episiotomy: none  Newborn Data: Live born female  Birth Weight: 5 lb 8.9 oz (2520 g) APGAR: 9, 9  Newborn Delivery    Birth date/time: 12/29/2018 20:38:00 Delivery type: Vaginal, Spontaneous       Postpartum Course  Patient had an uncomplicated postpartum course.  By time of discharge on PPD#2, her pain was controlled on oral pain medications; she had appropriate lochia and was ambulating, voiding without difficulty and tolerating regular diet.  She was deemed stable for discharge to home.       Labs: CBC Latest Ref Rng & Units 12/30/2018 12/29/2018 12/28/2018  WBC 4.0 - 10.5 K/uL 14.6(H) 12.5(H) 11.1(H)  Hemoglobin 12.0 - 15.0 g/dL 9.0(L) 11.2(L) 11.0(L)  Hematocrit 36.0 - 46.0 % 26.6(L) 32.3(L) 31.9(L)  Platelets 150 - 400 K/uL 216 251 273   B POS Performed at [facility], [address] [place], [place] [address]   Physical exam:  BP 123/82 (BP Location: Right Arm)   Pulse 75   Temp 98.5 F (36.9 C) (Oral)   Resp 18   Ht 5\' 1"  (1.549 m)   Wt 83 kg   LMP 03/29/2018   SpO2 100%   Breastfeeding Unknown   BMI 34.58 kg/m  General: alert and no distress Pulm: normal respiratory effort Lochia: appropriate Abdomen: soft, NT Uterine Fundus: firm, below umbilicus Extremities: No evidence of DVT seen on physical exam. No lower extremity edema.   Disposition: stable, discharge to home Baby Feeding: breastmilk Baby Disposition: home with mom  Contraception: TBD. Reviewed options. She will call the office when she decides.   Prenatal Labs:  Blood type/Rh B+  Antibody screen neg  Rubella Immune  Varicella Immune  RPR NR  HBsAg Neg  HIV NR  GC neg  Chlamydia neg  Genetic screening Negative, XX  1 hour GTT 107  3 hour GTT n/a  GBS negative   Rh Immune globulin given: n/a Rubella vaccine given: n/a Varicella vaccine given: n/a Tdap vaccine given in AP  or PP setting: 12/12/2018 Flu vaccine given in PP setting: Ordered for prior to discharge   Plan:  Ammi Hutt was discharged to home in good condition. Follow-up appointment at [place] with delivery  provider in 6 weeks  Discharge Instructions: Per After Visit Summary. Activity: Advance as tolerated. Pelvic rest for 6 weeks.   Diet: Regular Discharge Medications: Allergies as of 12/31/2018   No Known Allergies     Medication List    STOP taking these medications   metoCLOPramide 10 MG tablet Commonly known as: REGLAN     TAKE these medications   acetaminophen 325 MG tablet Commonly known as: Tylenol Take 2 tablets (650 mg total) by mouth every 4 (four) hours as needed (for pain scale < 4).   coconut oil Oil Apply 1 application topically as needed.   ferrous sulfate 325 (65 FE) MG tablet Take 1 tablet (325 mg total) by mouth 2 (two) times daily with a meal.   ibuprofen 600 MG tablet Commonly known as: ADVIL Take 1 tablet (600 mg total) by mouth every 6 (six) hours.   prenatal multivitamin Tabs tablet Take 1 tablet by mouth daily at 12 noon.      Outpatient follow up:  Follow-up Information    Lisette Grinder, CNM. Schedule an appointment as soon as possible for a visit in 6 week(s).   Specialty: Certified Nurse Midwife Why: For routine postpartum visit Contact information: [place] [address] [phone]            Signed: Minda Meo, CNM

## 2018-12-29 NOTE — Progress Notes (Signed)
Labor Progress Note  Jamisha Hoeschen is a 23 y.o. G1P0 at [redacted]w[redacted]d by ultrasound admitted for induction of labor due to preeclampsia without severe features.  Subjective:  Feeling contractions, starting to get stronger. Sitting up at the bedside eating lunch.   Objective: BP 123/72 (BP Location: Left Arm)   Pulse 73   Temp 98.6 F (37 C) (Oral)   Resp 18   Ht 5\' 1"  (1.549 m)   Wt 83 kg   BMI 34.58 kg/m   Fetal Assessment: FHT:  FHR: 130-140bpm, variability: moderate,  accelerations:  Present,  decelerations:  Present variable Category/reactivity:  Category II UC:   regular, every 1.5-4 minutes SVE:    Dilation: 1cm  Effacement: 60%  Station:  -3  Consistency: soft  Position: middle  Membrane status: intact Amniotic color: n/a  Labs: Lab Results  Component Value Date   WBC 11.1 (H) 12/28/2018   HGB 11.0 (L) 12/28/2018   HCT 31.9 (L) 12/28/2018   MCV 86.9 12/28/2018   PLT 273 12/28/2018    Assessment / Plan: Induction of labor due to preeclampsia without severe features, latent labor  Labor: Pitocin infusing at 42mu/min, continue to titrate per protocol to adequate contraction pattern; foley balloon placed now Preeclampsia:  labs stable and no evidence of severe features, normal to mild range BPs Fetal Wellbeing:  Category II for occasional variable, overall reassurring Pain Control:  Labor support without medications at this time, plans epidural at some poitn I/D:  n/a Anticipated MOD:  NSVD  Lisette Grinder, CNM 12/29/2018, 12:41 PM

## 2018-12-29 NOTE — Progress Notes (Signed)
Labor Progress Note  Gabriela Davidson is a 23 y.o. G1P0 at [redacted]w[redacted]d by ultrasound admitted for induction of labor due to preeclampsia without severe features.  Subjective:  Comfortable with epidural in place, starting to feel vaginal pressure with contractions.   Objective: BP (!) 147/85   Pulse (!) 102   Temp 97.8 F (36.6 C) (Oral)   Resp 16   Ht 5\' 1"  (1.549 m)   Wt 83 kg   SpO2 97%   BMI 34.58 kg/m   Fetal Assessment: FHT:  FHR: 130-140bpm, variability: minimal, moderate,  accelerations:  Present,  decelerations:  Present variable Category/reactivity:  Category II UC:   regular, every 1.5-3 minutes SVE:    Dilation: 10cm  Effacement: 100%  Station:  +1  Consistency: soft  Position: middle  Membrane status: AROM 1743 Amniotic color: scant, blood tinged  Labs: Lab Results  Component Value Date   WBC 12.5 (H) 12/29/2018   HGB 11.2 (L) 12/29/2018   HCT 32.3 (L) 12/29/2018   MCV 87.3 12/29/2018   PLT 251 12/29/2018    Assessment / Plan: Induction of labor due to preeclampsia without severe features, active labor  Labor: Pitocin infusing at 69mu/min; s/p foley balloon; IUPC in place Preeclampsia:  labs stable and no evidence of severe features, normal to mild range BPs Fetal Wellbeing:  Category II, overall reassurring Pain Control:  Epidural I/D:  n/a Anticipated MOD:  NSVD  Lisette Grinder, CNM 12/29/2018, 8:13 PM

## 2018-12-30 LAB — CBC
HCT: 26.6 % — ABNORMAL LOW (ref 36.0–46.0)
Hemoglobin: 9 g/dL — ABNORMAL LOW (ref 12.0–15.0)
MCH: 29.8 pg (ref 26.0–34.0)
MCHC: 33.8 g/dL (ref 30.0–36.0)
MCV: 88.1 fL (ref 80.0–100.0)
Platelets: 216 10*3/uL (ref 150–400)
RBC: 3.02 MIL/uL — ABNORMAL LOW (ref 3.87–5.11)
RDW: 13.4 % (ref 11.5–15.5)
WBC: 14.6 10*3/uL — ABNORMAL HIGH (ref 4.0–10.5)
nRBC: 0 % (ref 0.0–0.2)

## 2018-12-30 MED ORDER — FERROUS SULFATE 325 (65 FE) MG PO TABS
325.0000 mg | ORAL_TABLET | Freq: Two times a day (BID) | ORAL | Status: DC
  Administered 2018-12-30 – 2018-12-31 (×2): 325 mg via ORAL
  Filled 2018-12-30 (×2): qty 1

## 2018-12-30 MED ORDER — SIMETHICONE 80 MG PO CHEW: 160.0000 mg | CHEWABLE_TABLET | ORAL | Status: DC | PRN

## 2018-12-30 MED ORDER — PRENATAL MULTIVITAMIN CH
1.0000 | ORAL_TABLET | Freq: Every day | ORAL | Status: DC
  Administered 2018-12-30 – 2018-12-31 (×2): 1 via ORAL
  Filled 2018-12-30 (×2): qty 1

## 2018-12-30 MED ORDER — ONDANSETRON HCL 4 MG PO TABS: 4.0000 mg | ORAL_TABLET | ORAL | Status: DC | PRN

## 2018-12-30 MED ORDER — SENNOSIDES-DOCUSATE SODIUM 8.6-50 MG PO TABS
2.0000 | ORAL_TABLET | ORAL | Status: DC
  Administered 2018-12-30 – 2018-12-31 (×2): 2 via ORAL
  Filled 2018-12-30 (×2): qty 2

## 2018-12-30 MED ORDER — SENNOSIDES-DOCUSATE SODIUM 8.6-50 MG PO TABS: 2.0000 | ORAL_TABLET | ORAL | Status: DC

## 2018-12-30 MED ORDER — WITCH HAZEL-GLYCERIN EX PADS
1.0000 "application " | MEDICATED_PAD | CUTANEOUS | Status: DC
  Administered 2018-12-30: 1 via TOPICAL
  Filled 2018-12-30 (×2): qty 100

## 2018-12-30 MED ORDER — DIPHENHYDRAMINE HCL 25 MG PO CAPS: 25.0000 mg | ORAL_CAPSULE | Freq: Four times a day (QID) | ORAL | Status: DC | PRN

## 2018-12-30 MED ORDER — COCONUT OIL OIL
1.0000 "application " | TOPICAL_OIL | Status: DC | PRN
  Administered 2018-12-31: 1 via TOPICAL
  Filled 2018-12-30: qty 120

## 2018-12-30 MED ORDER — DIBUCAINE (PERIANAL) 1 % EX OINT: 1.0000 "application " | TOPICAL_OINTMENT | CUTANEOUS | Status: DC | PRN

## 2018-12-30 MED ORDER — ONDANSETRON HCL 4 MG/2ML IJ SOLN: 4.0000 mg | INTRAMUSCULAR | Status: DC | PRN

## 2018-12-30 NOTE — Lactation Note (Addendum)
This note was copied from a baby's chart. Lactation Consultation Note  Patient Name: Gabriela Davidson IDPOE'U Date: 12/30/2018 Reason for consult: Follow-up assessment BAby latched to breast without nipple shield which was started overnight, few sucks but not consistent, applied nipple shield and also had a few sucks but tires easily and cannot sustain several sucks in a row, falls asleep and needs lots of stimulation to suck, mom set up to pump with DEBP in initiation mode to provide supplemental colostrum, baby observed to gag and swallow back fluid/mucous that has come up in her throat,  obtained 2.7 cc and this was given to baby per syringe slowly, baby occ. suckled on gloved finger but disorganized suck, mostly sticking tongue out, spit very small amt mucous after while burping    Maternal Data Formula Feeding for Exclusion: No Has patient been taught Hand Expression?: Yes Does the patient have breastfeeding experience prior to this delivery?: No  Feeding Feeding Type: Breast Fed  LATCH Score Latch: Repeated attempts needed to sustain latch, nipple held in mouth throughout feeding, stimulation needed to elicit sucking reflex.  Audible Swallowing: None  Type of Nipple: Everted at rest and after stimulation  Comfort (Breast/Nipple): Soft / non-tender  Hold (Positioning): Full assist, staff holds infant at breast  LATCH Score: 5  Interventions Interventions: Breast feeding basics reviewed;Assisted with latch;Skin to skin;Breast massage;Hand express;Support pillows;Position options;Expressed milk;DEBP  Lactation Tools Discussed/Used Tools: 52F feeding tube / Syringe Nipple shield size: 16 WIC Program: No Pump Review: Setup, frequency, and cleaning;Milk Storage Initiated by:: Chaya Jan RNC IBCLC Date initiated:: 12/30/18   Consult Status Consult Status: Follow-up Date: 12/30/18 Follow-up type: In-patient Attempt breast again in 3 hrs.   Ferol Luz 12/30/2018,  11:42 AM

## 2018-12-30 NOTE — Anesthesia Postprocedure Evaluation (Signed)
Anesthesia Post Note  Patient: Gabriela Davidson  Procedure(s) Performed: AN AD [facility]man  Patient location during evaluation: Mother Baby Anesthesia Type: Epidural Level of consciousness: awake and alert Pain management: pain level controlled Vital Signs Assessment: post-procedure vital signs reviewed and stable Respiratory status: spontaneous breathing, nonlabored ventilation and respiratory function stable Cardiovascular status: stable Postop Assessment: no headache, no backache and epidural receding Anesthetic complications: no     Last Vitals:  Vitals:   12/30/18 0105 12/30/18 0305  BP: 128/78 132/71  Pulse: 83 83  Resp: 18 18  Temp: 36.9 C 37.1 C  SpO2: 100% 98%    Last Pain:  Vitals:   12/30/18 0305  TempSrc: Oral  PainSc:                  Jerrye Noble

## 2018-12-30 NOTE — Lactation Note (Signed)
This note was copied from a baby's chart. Lactation Consultation Note  Patient Name: Gabriela Davidson JMEQA'S Date: 12/30/2018 Reason for consult: Follow-up assessment   Maternal Data Formula Feeding for Exclusion: No  Feeding Feeding Type: Breast Milk BAby would not latch at all this feeding, attempted with and without nipple shield, gagged with both, would not suck on slow flow nipple with DBM in bottle, givem 3 cc slowly by syringe in cheek, baby spit up most of this with mucous LATCH Score Latch: Too sleepy or reluctant, no latch achieved, no sucking elicited.                 Interventions Interventions: DEBP  Lactation Tools Discussed/Used   Mom to continue attempting at breast tonight and then pumping after and then supplement with EBM or DBM    Consult Status Consult Status: Follow-up Date: 12/31/18 Follow-up type: In-patient    Ferol Luz 12/30/2018, 5:32 PM

## 2018-12-30 NOTE — Progress Notes (Signed)
Post Partum Day 1 Subjective: Doing well, no complaints.  Tolerating regular diet, pain with PO meds, voiding and ambulating without difficulty. Very mild HA this am, but has not had breakfast yet.   No CP SOB Fever,Chills, N/V or leg pain; denies nipple or breast pain, no change of vision, RUQ/epigastric pain  Objective: BP 119/84 (BP Location: Right Arm)   Pulse 81   Temp 97.9 F (36.6 C) (Oral)   Resp 18   Ht 5\' 1"  (1.549 m)   Wt 83 kg   LMP 03/29/2018   SpO2 100%   Breastfeeding Unknown   BMI 34.58 kg/m    Reviewed: Normotensive since midnight.    Physical Exam:  General: NAD Breasts: soft/nontender CV: RRR Pulm: nl effort, CTABL Abdomen: soft, NT, BS x 4 Perineum: minimal edema, repair well approximated Lochia: small Uterine Fundus: fundus firm and 2 fb below umbilicus DVT Evaluation: no cords, ttp LEs   Recent Labs    12/29/18 1522 12/30/18 0718  HGB 11.2* 9.0*  HCT 32.3* 26.6*  WBC 12.5* 14.6*  PLT 251 216    Assessment/Plan: 23 y.o. G1P1001 postpartum day # 1  - Continue routine PP care - Lactation consult prn.  - Discussed contraceptive options including implant, IUDs hormonal and non-hormonal, injection, pills/ring/patch, condoms, and NFP. Undecided on contraception.  - Acute blood loss anemia - hemodynamically stable and asymptomatic; start po ferrous sulfate BID with stool softeners  - Immunization status:  all Imms up to date   Disposition: Does not desire Dc home today.    Francetta Found, CNM 12/30/2018  8:22 AM

## 2018-12-31 ENCOUNTER — Ambulatory Visit: Payer: Self-pay

## 2018-12-31 MED ORDER — FERROUS SULFATE 325 (65 FE) MG PO TABS: 325.0000 mg | ORAL_TABLET | Freq: Two times a day (BID) | ORAL | 3 refills | Status: DC

## 2018-12-31 MED ORDER — ACETAMINOPHEN 325 MG PO TABS: 650.0000 mg | ORAL_TABLET | ORAL | Status: DC | PRN

## 2018-12-31 MED ORDER — COCONUT OIL OIL: 1.0000 "application " | TOPICAL_OIL | 0 refills | Status: DC | PRN

## 2018-12-31 MED ORDER — PRENATAL MULTIVITAMIN CH: 1.0000 | ORAL_TABLET | Freq: Every day | ORAL | Status: DC

## 2018-12-31 MED ORDER — IBUPROFEN 600 MG PO TABS: 600.0000 mg | ORAL_TABLET | Freq: Four times a day (QID) | ORAL | 0 refills | Status: DC

## 2018-12-31 NOTE — Progress Notes (Signed)
Pt discharged with infant. Discharge instructions, prescriptions, and follow up appointments given to and reviewed with patient. Pt verbalized understanding. Escorted out by staff. 

## 2018-12-31 NOTE — Lactation Note (Addendum)
This note was copied from a baby's chart. Lactation Consultation Note  Patient Name: Gabriela Davidson EXBMW'U Date: 12/31/2018 Reason for consult: Follow-up assessment;Mother's request;Primapara;Early term 37-38.6wks;Infant < 6lbs;Other (Comment)(Baby on & off breast)  Mom had called with concern that Gabriela Davidson was not waking to breast feed and worried she was not taking in enough breast milk from mom.  Gabriela Davidson was born at 37.4 weeks and was SGA weighing 5 lbs 8.9 oz.  When went into the room mom was getting ready to pump using [employer] DEBP set up in room.  Gabriela Davidson was putting her hands to her mouth.  Encouraged mom to take advantage of feeding cues.  Mom reports trying a couple of times in the last hour or 2 but could not get her to breast feed.  Assisted mom with positioning with pillow support with mom sitting in chair.  Demonstrated how to hand express after a couple of tries to get some colostrum to entice Gabriela Davidson to latch.  Gabriela Davidson latched with minimal assistance and immediately began sucking, but shortly into feeding she was on and off the breast.  Demonstrated how to massage breast and hold her at her neck between her ears to keep her close with nose and chin touching to prevent her from coming off the breast.  After a couple of times trying to push off the breast, she quit pushing away and sustained the latch with good, strong sucking with audible swallows, which were pointed out to mom.  She remained at the breast for 10 minutes of frequent sucking with stimulation.  Mom reports this is the longest she has ever stayed at the breast.  Demonstrated how to keep massaging breast and stimulating Gabriela Davidson at the nape of her neck to keep her actively sucking at the breast.  The next time she came off the right breast, we placed her on the left breast where she fed for another 6 to 7 minutes before falling asleep.  Even though she was not sucking, we left Gabriela Davidson skin to skin at the breast.  Mom denies any breast  or nipple pain.  Mom has coconut oil already.  Demonstrated how hand express colostrum and rub on nipples to prevent bacteria, lubricate and for comfort.  Reviewed newborn stomach size, supply and demand, normal course of lactation, how to know she was getting milk and routine newborn feeding patterns.  Mom and baby is to be discharged today.  Parents not wanting to give any formula, but concerned about borderline jaundice and poor feedings through the night until mature milk comes in.  Mom is willing to breast feed, pump and supplement with syringe or bottle with slow flow nipple giving any milk she expresses if necessary.  Mom has an [employer] pump at home and plans to get more durable DEBP through [employer].  Process of getting DEBP through insurance discussed.  Since there are no babies in [facility]N getting DBM at this time, unused bottle of DBM, 50 ml, already opened for Gabriela Davidson in breast milk refrigerator given for parents to take home for supplementation if poor breast feed.  Mom has F/U Pediatric appointment tomorrow.   Hand out given on lactation community resources with contact numbers and discussed with parents and encouraged to call with any questions, concerns or assistance.    Maternal Data Formula Feeding for Exclusion: No Has patient been taught Hand Expression?: Yes Does the patient have breastfeeding experience prior to this delivery?: No(P1)  Feeding Feeding Type: Breast Fed  LATCH Score  Latch: Grasps breast easily, tongue down, lips flanged, rhythmical sucking.  Audible Swallowing: A few with stimulation  Type of Nipple: Everted at rest and after stimulation(Small breasts and nipples)  Comfort (Breast/Nipple): Soft / non-tender  Hold (Positioning): No assistance needed to correctly position infant at breast.  LATCH Score: 9  Interventions Interventions: Breast feeding basics reviewed;Assisted with latch;Skin to skin;Breast massage;Hand express;Breast compression;Adjust  position;Support pillows;Position options;Coconut oil;Comfort gels  Lactation Tools Discussed/Used Tools: Pump;Coconut oil Nipple shield size: (No need to use nipple shield) Breast pump type: Double-Electric Breast Pump WIC Program: No(BCBS Insurance) Pump Review: Setup, frequency, and cleaning;Milk Storage;Other (comment) Initiated by:: M.Barbee,RNC,BSN Date initiated:: 12/30/18   Consult Status Consult Status: PRN Follow-up type: Call as needed    Louis Meckel 12/31/2018, 2:33 PM

## 2018-12-31 NOTE — Discharge Instructions (Signed)
After Your Delivery Discharge Instructions °  °Postpartum: Care Instructions ° °After childbirth (postpartum period), your body goes through many changes. Some of these changes happen over several weeks. In the hours after delivery, your body will begin to recover from childbirth while it prepares to breastfeed your newborn. You may feel emotional during this time. Your hormones can shift your mood without warning for no clear reason. ° °In the first couple of weeks after childbirth, many women have emotions that change from happy to sad. You may find it hard to sleep. You may cry a lot. This is called the "baby blues." These overwhelming emotions often go away within a couple of days or weeks. But it's important to discuss your feelings with your doctor. ° °You should call your care provider if you have unrelieved feelings of: °· Inability to cope °· Sadness °· Anxiety °· Lack of interest in baby °· Insomnia °· Crying ° °It is easy to get too tired and overwhelmed during the first weeks after childbirth. Don't try to do too much. Get rest whenever you can, accept help from others, and eat well and drink plenty of fluids. ° °About 4 to 6 weeks after your baby's birth, you will have a follow-up visit with your care provider. This visit is your time to talk to your provider about anything you are concerned or curious about. ° °Follow-up care is a key part of your treatment and safety. Be sure to make and go to all appointments, and call your doctor if you are having problems. It's also a good idea to know your test results and keep a list of the medicines you take. ° °How can you care for yourself at home? °· Sleep or rest when your baby sleeps. °· Get help with household chores from family or friends, if you can. Do not try to do it all yourself. °· If you have hemorrhoids or swelling or pain around the opening of your vagina, try using cold and heat. You can put ice or a cold pack on the area for 10 to 20 minutes at  a time. Put a thin cloth between the ice and your skin. Also try sitting in a few inches of warm water (sitz bath) 3 times a day and after bowel movements. °· Take pain medicines exactly as directed. °· If the provider gave you a prescription medicine for pain, take it as prescribed. °· If you do not have a prescription and need something over the counter, you can take: °· Ibuprofen (Motrin, Advil) up to 600mg every 6 hours as needed for pain °· Acetaminophen (Tylenol) up to 650mg every 4 hours as needed for pain °· Some people find it helpful to alternate between these two medications.  °· No driving for 1-2 weeks or while taking pain medications.  °· Eat more fiber to avoid constipation. Include foods such as whole-grain breads and cereals, raw vegetables, raw and dried fruits, and beans. °· Drink plenty of fluids, enough so that your urine is light yellow or clear like water. If you have kidney, heart, or liver disease and have to limit fluids, talk with your doctor before you increase the amount of fluids you drink. °· Do not put anything in the vagina for 6 weeks. This means no sex, no tampons, no douching, and no enemas. °· If you have stitches, keep the area clean by pouring or spraying warm water over the area outside your vagina and anus after you use the toilet. °·   No strenuous activity or heavy lifting for 6 weeks   No tub baths; showers only  Continue prenatal vitamin and iron.  If breastfeeding:  Increase calories and fluids while breastfeeding.  You may have a slight fever when your milk comes in, but it should go away on its own. If it does not, and rises above 101.0 please call the doctor.  For breastfeeding concerns, the lactation consultant can be reached at [phone].  For concerns about your baby, please call your pediatrician.   Keep a list of questions to bring to your postpartum visit. Your questions might be about:  Changes in your breasts, such as lumps or  soreness.  When to expect your menstrual period to start again.  What form of birth control is best for you.  Weight you have put on during the pregnancy.  Exercise options.  What foods and drinks are best for you, especially if you are breastfeeding.  Problems you might be having with breastfeeding.  When you can have sex. Some women may want to talk about lubricants for the vagina.  Any feelings of sadness or restlessness that you are having.   When should you call for help?  Call 911 anytime you think you may need emergency care. For example, call if:  You have thoughts of harming yourself, your baby, or another person.  You passed out (lost consciousness).  Call the office at [phone] or seek immediate medical care if:  If you have heavy bleeding such that you are soaking 1 pad in an hour for 2 hours  You are dizzy or lightheaded, or you feel like you may faint.  You have a fever; a temperature of 101.0 F or greater  Chills  Difficulty urinating  Headache unrelieved by "pain meds"   Visual changes  Pain in the right side of your belly near your ribs  Breasts reddened, hard, hot to the touch or any other breast concerns  Nipple discharge which is foul-smelling or contains pus   Increased pain at the site of the tear  New pain unrelieved with recommended over-the-counter dosages  Difficulty breathing with or without chest pain   New leg pain, swelling, or redness, especially if it is only on one leg  Any other concerns  Watch closely for changes in your health, and be sure to contact your provider if:  You have new or worse vaginal discharge.  You feel sad or depressed.  You are having problems with your breasts or breastfeeding.   Breastfeeding  Choosing to breastfeed is one of the best decisions you can make for yourself and your baby. A change in hormones during pregnancy causes your breasts to make breast milk in your milk-producing  glands. Hormones prevent breast milk from being released before your baby is born. They also prompt milk flow after birth. Once breastfeeding has begun, thoughts of your baby, as well as his or her sucking or crying, can stimulate the release of milk from your milk-producing glands. Benefits of breastfeeding Research shows that breastfeeding offers many health benefits for infants and mothers. It also offers a cost-free and convenient way to feed your baby. For your baby  Your first milk (colostrum) helps your baby's digestive system to function better.  Special cells in your milk (antibodies) help your baby to fight off infections.  Breastfed babies are less likely to develop asthma, allergies, obesity, or type 2 diabetes. They are also at lower risk for sudden infant death syndrome (SIDS).  Nutrients  in breast milk are better able to meet your babys needs compared to infant formula.  Breast milk improves your baby's brain development. For you  Breastfeeding helps to create a very special bond between you and your baby.  Breastfeeding is convenient. Breast milk costs nothing and is always available at the correct temperature.  Breastfeeding helps to burn calories. It helps you to lose the weight that you gained during pregnancy.  Breastfeeding makes your uterus return faster to its size before pregnancy. It also slows bleeding (lochia) after you give birth.  Breastfeeding helps to lower your risk of developing type 2 diabetes, osteoporosis, rheumatoid arthritis, cardiovascular disease, and breast, ovarian, uterine, and endometrial cancer later in life. Breastfeeding basics Starting breastfeeding  Find a comfortable place to sit or lie down, with your neck and back well-supported.  Place a pillow or a rolled-up blanket under your baby to bring him or her to the level of your breast (if you are seated). Nursing pillows are specially designed to help support your arms and your baby while  you breastfeed.  Make sure that your baby's tummy (abdomen) is facing your abdomen.  Gently massage your breast. With your fingertips, massage from the outer edges of your breast inward toward the nipple. This encourages milk flow. If your milk flows slowly, you may need to continue this action during the feeding.  Support your breast with 4 fingers underneath and your thumb above your nipple (make the letter "C" with your hand). Make sure your fingers are well away from your nipple and your babys mouth.  Stroke your baby's lips gently with your finger or nipple.  When your baby's mouth is open wide enough, quickly bring your baby to your breast, placing your entire nipple and as much of the areola as possible into your baby's mouth. The areola is the colored area around your nipple. ? More areola should be visible above your baby's upper lip than below the lower lip. ? Your baby's lips should be opened and extended outward (flanged) to ensure an adequate, comfortable latch. ? Your baby's tongue should be between his or her lower gum and your breast.  Make sure that your baby's mouth is correctly positioned around your nipple (latched). Your baby's lips should create a seal on your breast and be turned out (everted).  It is common for your baby to suck about 2-3 minutes in order to start the flow of breast milk. Latching Teaching your baby how to latch onto your breast properly is very important. An improper latch can cause nipple pain, decreased milk supply, and poor weight gain in your baby. Also, if your baby is not latched onto your nipple properly, he or she may swallow some air during feeding. This can make your baby fussy. Burping your baby when you switch breasts during the feeding can help to get rid of the air. However, teaching your baby to latch on properly is still the best way to prevent fussiness from swallowing air while breastfeeding. Signs that your baby has successfully  latched onto your nipple  Silent tugging or silent sucking, without causing you pain. Infant's lips should be extended outward (flanged).  Swallowing heard between every 3-4 sucks once your milk has started to flow (after your let-down milk reflex occurs).  Muscle movement above and in front of his or her ears while sucking. Signs that your baby has not successfully latched onto your nipple  Sucking sounds or smacking sounds from your baby while breastfeeding.  Nipple pain. If you think your baby has not latched on correctly, slip your finger into the corner of your babys mouth to break the suction and place it between your baby's gums. Attempt to start breastfeeding again. Signs of successful breastfeeding Signs from your baby  Your baby will gradually decrease the number of sucks or will completely stop sucking.  Your baby will fall asleep.  Your baby's body will relax.  Your baby will retain a small amount of milk in his or her mouth.  Your baby will let go of your breast by himself or herself. Signs from you  Breasts that have increased in firmness, weight, and size 1-3 hours after feeding.  Breasts that are softer immediately after breastfeeding.  Increased milk volume, as well as a change in milk consistency and color by the fifth day of breastfeeding.  Nipples that are not sore, cracked, or bleeding. Signs that your baby is getting enough milk  Wetting at least 1-2 diapers during the first 24 hours after birth.  Wetting at least 5-6 diapers every 24 hours for the first week after birth. The urine should be clear or pale yellow by the age of 5 days.  Wetting 6-8 diapers every 24 hours as your baby continues to grow and develop.  At least 3 stools in a 24-hour period by the age of 5 days. The stool should be soft and yellow.  At least 3 stools in a 24-hour period by the age of 7 days. The stool should be seedy and yellow.  No loss of weight greater than 10% of  birth weight during the first 3 days of life.  Average weight gain of 4-7 oz (113-198 g) per week after the age of 4 days.  Consistent daily weight gain by the age of 5 days, without weight loss after the age of 2 weeks. After a feeding, your baby may spit up a small amount of milk. This is normal. Breastfeeding frequency and duration Frequent feeding will help you make more milk and can prevent sore nipples and extremely full breasts (breast engorgement). Breastfeed when you feel the need to reduce the fullness of your breasts or when your baby shows signs of hunger. This is called "breastfeeding on demand." Signs that your baby is hungry include:  Increased alertness, activity, or restlessness.  Movement of the head from side to side.  Opening of the mouth when the corner of the mouth or cheek is stroked (rooting).  Increased sucking sounds, smacking lips, cooing, sighing, or squeaking.  Hand-to-mouth movements and sucking on fingers or hands.  Fussing or crying. Avoid introducing a pacifier to your baby in the first 4-6 weeks after your baby is born. After this time, you may choose to use a pacifier. Research has shown that pacifier use during the first year of a baby's life decreases the risk of sudden infant death syndrome (SIDS). Allow your baby to feed on each breast as long as he or she wants. When your baby unlatches or falls asleep while feeding from the first breast, offer the second breast. Because newborns are often sleepy in the first few weeks of life, you may need to awaken your baby to get him or her to feed. Breastfeeding times will vary from baby to baby. However, the following rules can serve as a guide to help you make sure that your baby is properly fed:  Newborns (babies 9 weeks of age or younger) may breastfeed every 1-3 hours.  Newborns should  not go without breastfeeding for longer than 3 hours during the day or 5 hours during the night.  You should breastfeed  your baby a minimum of 8 times in a 24-hour period. Breast milk pumping     Pumping and storing breast milk allows you to make sure that your baby is exclusively fed your breast milk, even at times when you are unable to breastfeed. This is especially important if you go back to work while you are still breastfeeding, or if you are not able to be present during feedings. Your lactation consultant can help you find a method of pumping that works best for you and give you guidelines about how long it is safe to store breast milk. Caring for your breasts while you breastfeed Nipples can become dry, cracked, and sore while breastfeeding. The following recommendations can help keep your breasts moisturized and healthy:  Avoid using soap on your nipples.  Wear a supportive bra designed especially for nursing. Avoid wearing underwire-style bras or extremely tight bras (sports bras).  Air-dry your nipples for 3-4 minutes after each feeding.  Use only cotton bra pads to absorb leaked breast milk. Leaking of breast milk between feedings is normal.  Use lanolin on your nipples after breastfeeding. Lanolin helps to maintain your skin's normal moisture barrier. Pure lanolin is not harmful (not toxic) to your baby. You may also hand express a few drops of breast milk and gently massage that milk into your nipples and allow the milk to air-dry. In the first few weeks after giving birth, some women experience breast engorgement. Engorgement can make your breasts feel heavy, warm, and tender to the touch. Engorgement peaks within 3-5 days after you give birth. The following recommendations can help to ease engorgement:  Completely empty your breasts while breastfeeding or pumping. You may want to start by applying warm, moist heat (in the shower or with warm, water-soaked hand towels) just before feeding or pumping. This increases circulation and helps the milk flow. If your baby does not completely empty your  breasts while breastfeeding, pump any extra milk after he or she is finished.  Apply ice packs to your breasts immediately after breastfeeding or pumping, unless this is too uncomfortable for you. To do this: ? Put ice in a plastic bag. ? Place a towel between your skin and the bag. ? Leave the ice on for 20 minutes, 2-3 times a day.  Make sure that your baby is latched on and positioned properly while breastfeeding. If engorgement persists after 48 hours of following these recommendations, contact your health care provider or a Advertising copywriterlactation consultant. Overall health care recommendations while breastfeeding  Eat 3 healthy meals and 3 snacks every day. Well-nourished mothers who are breastfeeding need an additional 450-500 calories a day. You can meet this requirement by increasing the amount of a balanced diet that you eat.  Drink enough water to keep your urine pale yellow or clear.  Rest often, relax, and continue to take your prenatal vitamins to prevent fatigue, stress, and low vitamin and mineral levels in your body (nutrient deficiencies).  Do not use any products that contain nicotine or tobacco, such as cigarettes and e-cigarettes. Your baby may be harmed by chemicals from cigarettes that pass into breast milk and exposure to secondhand smoke. If you need help quitting, ask your health care provider.  Avoid alcohol.  Do not use illegal drugs or marijuana.  Talk with your health care provider before taking any medicines. These include  over-the-counter and prescription medicines as well as vitamins and herbal supplements. Some medicines that may be harmful to your baby can pass through breast milk.  It is possible to become pregnant while breastfeeding. If birth control is desired, ask your health care provider about options that will be safe while breastfeeding your baby. Where to find more information: [employer]: www.llli.org Contact a health care provider  if:  You feel like you want to stop breastfeeding or have become frustrated with breastfeeding.  Your nipples are cracked or bleeding.  Your breasts are red, tender, or warm.  You have: ? Painful breasts or nipples. ? A swollen area on either breast. ? A fever or chills. ? Nausea or vomiting. ? Drainage other than breast milk from your nipples.  Your breasts do not become full before feedings by the fifth day after you give birth.  You feel sad and depressed.  Your baby is: ? Too sleepy to eat well. ? Having trouble sleeping. ? More than 68 week old and wetting fewer than 6 diapers in a 24-hour period. ? Not gaining weight by 94 days of age.  Your baby has fewer than 3 stools in a 24-hour period.  Your baby's skin or the white parts of his or her eyes become yellow. Get help right away if:  Your baby is overly tired (lethargic) and does not want to wake up and feed.  Your baby develops an unexplained fever. Summary  Breastfeeding offers many health benefits for infant and mothers.  Try to breastfeed your infant when he or she shows early signs of hunger.  Gently tickle or stroke your baby's lips with your finger or nipple to allow the baby to open his or her mouth. Bring the baby to your breast. Make sure that much of the areola is in your baby's mouth. Offer one side and burp the baby before you offer the other side.  Talk with your health care provider or lactation consultant if you have questions or you face problems as you breastfeed. This information is not intended to replace advice given to you by your health care provider. Make sure you discuss any questions you have with your health care provider. Document Released: 12/29/2004 Document Revised: 03/25/2017 Document Reviewed: 01/31/2016 Elsevier Patient Education  2020 [employer].

## 2019-12-16 ENCOUNTER — Emergency Department: Payer: BC Managed Care – PPO

## 2019-12-16 ENCOUNTER — Other Ambulatory Visit: Payer: Self-pay

## 2019-12-16 ENCOUNTER — Inpatient Hospital Stay: Payer: BC Managed Care – PPO

## 2019-12-16 ENCOUNTER — Encounter: Payer: Self-pay | Admitting: Emergency Medicine

## 2019-12-16 ENCOUNTER — Inpatient Hospital Stay
Admission: EM | Admit: 2019-12-16 | Discharge: 2019-12-17 | DRG: 062 | Disposition: A | Discharge status: Home / Self Care | Payer: BC Managed Care – PPO | Attending: Pulmonary Disease | Admitting: Pulmonary Disease

## 2019-12-16 DIAGNOSIS — I639 Cerebral infarction, unspecified: Secondary | ICD-10-CM | POA: Diagnosis present

## 2019-12-16 DIAGNOSIS — G8194 Hemiplegia, unspecified affecting left nondominant side: Secondary | ICD-10-CM | POA: Diagnosis present

## 2019-12-16 DIAGNOSIS — R531 Weakness: Secondary | ICD-10-CM | POA: Diagnosis not present

## 2019-12-16 DIAGNOSIS — R29701 NIHSS score 1: Secondary | ICD-10-CM | POA: Diagnosis present

## 2019-12-16 DIAGNOSIS — F419 Anxiety disorder, unspecified: Secondary | ICD-10-CM | POA: Diagnosis present

## 2019-12-16 DIAGNOSIS — R200 Anesthesia of skin: Secondary | ICD-10-CM | POA: Diagnosis present

## 2019-12-16 DIAGNOSIS — H547 Unspecified visual loss: Secondary | ICD-10-CM | POA: Diagnosis present

## 2019-12-16 DIAGNOSIS — R000 Tachycardia, unspecified: Secondary | ICD-10-CM | POA: Diagnosis present

## 2019-12-16 DIAGNOSIS — Z20822 Contact with and (suspected) exposure to covid-19: Secondary | ICD-10-CM | POA: Diagnosis present

## 2019-12-16 DIAGNOSIS — I6389 Other cerebral infarction: Secondary | ICD-10-CM | POA: Diagnosis not present

## 2019-12-16 DIAGNOSIS — Z79899 Other long term (current) drug therapy: Secondary | ICD-10-CM | POA: Diagnosis not present

## 2019-12-16 HISTORY — DX: Allergic rhinitis, unspecified: J30.9

## 2019-12-16 HISTORY — DX: Cerebral infarction, unspecified: I63.9

## 2019-12-16 HISTORY — DX: Acne, unspecified: L70.9

## 2019-12-16 HISTORY — DX: Unspecified pre-eclampsia, unspecified trimester: O14.90

## 2019-12-16 LAB — DIFFERENTIAL
Abs Immature Granulocytes: 0.01 10*3/uL (ref 0.00–0.07)
Basophils Absolute: 0 10*3/uL (ref 0.0–0.1)
Basophils Relative: 0 %
Eosinophils Absolute: 0.1 10*3/uL (ref 0.0–0.5)
Eosinophils Relative: 1 %
Immature Granulocytes: 0 %
Lymphocytes Relative: 30 %
Lymphs Abs: 1.9 10*3/uL (ref 0.7–4.0)
Monocytes Absolute: 0.3 10*3/uL (ref 0.1–1.0)
Monocytes Relative: 5 %
Neutro Abs: 4 10*3/uL (ref 1.7–7.7)
Neutrophils Relative %: 64 %

## 2019-12-16 LAB — HCG, QUANTITATIVE, PREGNANCY: hCG, Beta Chain, Quant, S: <1 m[IU]/mL (ref <5)

## 2019-12-16 LAB — CBC
HCT: 39.9 % (ref 36.0–46.0)
Hemoglobin: 13.9 g/dL (ref 12.0–15.0)
MCH: 29.7 pg (ref 26.0–34.0)
MCHC: 34.8 g/dL (ref 30.0–36.0)
MCV: 85.3 fL (ref 80.0–100.0)
Platelets: 317 10*3/uL (ref 150–400)
RBC: 4.68 MIL/uL (ref 3.87–5.11)
RDW: 12.7 % (ref 11.5–15.5)
WBC: 6.4 10*3/uL (ref 4.0–10.5)
nRBC: 0 % (ref 0.0–0.2)

## 2019-12-16 LAB — COMPREHENSIVE METABOLIC PANEL
ALT: 17 U/L (ref 0–44)
AST: 19 U/L (ref 15–41)
Albumin: 4.1 g/dL (ref 3.5–5.0)
Alkaline Phosphatase: 65 U/L (ref 38–126)
Anion gap: 8 (ref 5–15)
BUN: 11 mg/dL (ref 6–20)
CO2: 24 mmol/L (ref 22–32)
Calcium: 8.8 mg/dL — ABNORMAL LOW (ref 8.9–10.3)
Chloride: 106 mmol/L (ref 98–111)
Creatinine, Ser: 0.69 mg/dL (ref 0.44–1.00)
GFR, Estimated: >60 mL/min (ref >60)
Glucose, Bld: 89 mg/dL (ref 70–99)
Potassium: 4 mmol/L (ref 3.5–5.1)
Sodium: 138 mmol/L (ref 135–145)
Total Bilirubin: 0.8 mg/dL (ref 0.3–1.2)
Total Protein: 8.1 g/dL (ref 6.5–8.1)

## 2019-12-16 LAB — APTT: aPTT: 32 seconds (ref 24–36)

## 2019-12-16 LAB — MRSA PCR SCREENING: MRSA by PCR: NEGATIVE

## 2019-12-16 LAB — RESP PANEL BY RT-PCR (FLU A&B, COVID) ARPGX2
Influenza A by PCR: NEGATIVE
Influenza B by PCR: NEGATIVE
SARS Coronavirus 2 by RT PCR: NEGATIVE

## 2019-12-16 LAB — PROTIME-INR
INR: 1.1 (ref 0.8–1.2)
Prothrombin Time: 13.4 seconds (ref 11.4–15.2)

## 2019-12-16 LAB — T4, FREE: Free T4: 0.79 ng/dL (ref 0.61–1.12)

## 2019-12-16 LAB — GLUCOSE, CAPILLARY: Glucose-Capillary: 69 mg/dL — ABNORMAL LOW (ref 70–99)

## 2019-12-16 LAB — TSH: TSH: 0.743 u[IU]/mL (ref 0.350–4.500)

## 2019-12-16 MED ORDER — STROKE: EARLY STAGES OF RECOVERY BOOK: Freq: Once | Status: DC

## 2019-12-16 MED ORDER — ONDANSETRON HCL 4 MG/2ML IJ SOLN: 4.0000 mg | Freq: Four times a day (QID) | INTRAMUSCULAR | Status: DC | PRN

## 2019-12-16 MED ORDER — DOCUSATE SODIUM 100 MG PO CAPS: 100.0000 mg | ORAL_CAPSULE | Freq: Two times a day (BID) | ORAL | Status: DC | PRN

## 2019-12-16 MED ORDER — GADOBUTROL 1 MMOL/ML IV SOLN
7.5000 mL | Freq: Once | INTRAVENOUS | Status: AC | PRN
  Administered 2019-12-16: 7.5 mL via INTRAVENOUS

## 2019-12-16 MED ORDER — FAMOTIDINE IN NACL 20-0.9 MG/50ML-% IV SOLN
20.0000 mg | Freq: Two times a day (BID) | INTRAVENOUS | Status: DC
  Administered 2019-12-16 – 2019-12-17 (×2): 20 mg via INTRAVENOUS
  Filled 2019-12-16 (×2): qty 50

## 2019-12-16 MED ORDER — ACETAMINOPHEN 325 MG PO TABS: 650.0000 mg | ORAL_TABLET | ORAL | Status: DC | PRN

## 2019-12-16 MED ORDER — SODIUM CHLORIDE 0.9 % IV SOLN: INTRAVENOUS | Status: AC

## 2019-12-16 MED ORDER — POLYETHYLENE GLYCOL 3350 17 G PO PACK: 17.0000 g | PACK | Freq: Every day | ORAL | Status: DC | PRN

## 2019-12-16 MED ORDER — IOHEXOL 350 MG/ML SOLN
75.0000 mL | Freq: Once | INTRAVENOUS | Status: AC | PRN
  Administered 2019-12-16: 75 mL via INTRAVENOUS

## 2019-12-16 MED ORDER — PANTOPRAZOLE SODIUM 40 MG IV SOLR: 40.0000 mg | Freq: Every day | INTRAVENOUS | Status: DC

## 2019-12-16 MED ORDER — SODIUM CHLORIDE 0.9 % IV SOLN
50.0000 mL | Freq: Once | INTRAVENOUS | Status: AC
  Administered 2019-12-16: 50 mL via INTRAVENOUS

## 2019-12-16 MED ORDER — CLEVIDIPINE BUTYRATE 0.5 MG/ML IV EMUL: 0.0000 mg/h | INTRAVENOUS | Status: DC

## 2019-12-16 MED ORDER — SENNOSIDES-DOCUSATE SODIUM 8.6-50 MG PO TABS: 1.0000 | ORAL_TABLET | Freq: Every evening | ORAL | Status: DC | PRN

## 2019-12-16 MED ORDER — ALTEPLASE (STROKE) FULL DOSE INFUSION
0.9000 mg/kg | Freq: Once | INTRAVENOUS | Status: AC
  Administered 2019-12-16: 64.4 mg via INTRAVENOUS
  Filled 2019-12-16: qty 100

## 2019-12-16 NOTE — ED Notes (Addendum)
Pt presents to ED with c/o of having L sided posterior leg numbness that started yesterday and pt states it resolved and today around 0900 and she also states she got a slight HA and had L sided floaters in perpipheral vision. Pt also states L sided numbness of L leg started posterior from thigh to knee and then became anterior of L leg from knee to tips od toes but pt denies any issues with soles of her feet. Pt denies weakness but does present with L sided numbness when bilateral strengths were checked, in legs and arms. Pt also states minimal sensation differences in L sided cheek area. Pt does not have any pronator drift at this time. Pt denies any periods of AMS. Pt denies any HX of strokes in the family only precursor pt states that is possible is she had preeclampsia with her last pregnancy, baby born dec 2020 and states pt was induced prior to due date (unknown gestation weeks). Pt states she was told she was at a "pre stroke state" pt states being on BP meds but states she was taken off BP medications. No apshaia noted.

## 2019-12-16 NOTE — ED Notes (Signed)
Pt escorted to CT, this RN at bedside, pt stable, Tpa infusing at this time.

## 2019-12-16 NOTE — H&P (Addendum)
Gabriela Davidson is an 24 y.o. female.   Chief Complaint: Acute left-sided weakness and numbness HPI: This is a 24 year old woman, lifelong never smoker, with a history as noted below, who presented to [facility] ED today with the complaint of acute left-sided weakness and numbness.  Approximately 1 week ago she had symptoms where she noted some discomfort in her hips.  This resolved.  She felt she had pulled a muscle.  Last night she developed numbness on the posterior aspect of her left thigh and she thought it was related to this pain she had had previously.  This resolved after a few minutes.  This morning while she was drinking coffee she acutely developed numbness to the anterior aspect of her left leg below the knee which then spread to involve her foot the back of her leg and back of her thigh.  She also experienced vision loss to the temporal aspect of her left eye.  She had never had any similar episodes like this previously.  She does not endorse any facial numbness or speech slurring.  She has no word finding difficulty.  No headache.  She has not had any chest pain, shortness of breath or lower extremity edema.  No fevers chills or sweats.  No nausea or vomiting.  No abdominal discomfort.  She had a baby on 28 December 2018 after induction due to preeclampsia without severe features and associated hyperemesis and transaminitis.  She is not breast-feeding.  LMP 12/3.  In the emergency room she was evaluated by neurology and was noted to have weakness on the left upper extremity and left lower extremity compared to the right.  Was also noted to have decreased sensory appreciation entire lower left leg below the knee as well as the posterior and lateral left thigh.  There was also decreased temperature sensation to the left lower extremity.  Code stroke CT scan of the head was normal.  She received TPA per neurology direction.  PCCM asked to admit the patient to ICU for code stroke post TPA  protocol.  Past Medical History:  Diagnosis Date  . Acne    Takes spironolactone for acne  . Allergic rhinitis   . Gestational hypertension, third trimester 12/28/2018  . Pre-eclampsia    History reviewed. No pertinent surgical history.  History reviewed. No pertinent family history.    Social History   Tobacco Use  . Smoking status: Never Smoker  . Smokeless tobacco: Never Used  Substance Use Topics  . Alcohol use: Never    Allergies: No Known Allergies  Current Meds  Medication Sig  . desloratadine (CLARINEX) 5 MG tablet Take 5 mg by mouth daily.  Marland Kitchen spironolactone (ALDACTONE) 50 MG tablet Take 50 mg by mouth 2 (two) times daily.    Results for orders placed or performed during the hospital encounter of 12/16/19 (from the past 48 hour(s))  Protime-INR     Status: None   Collection Time: 12/16/19 11:36 AM  Result Value Ref Range   Prothrombin Time 13.4 11.4 - 15.2 seconds   INR 1.1 0.8 - 1.2    Comment: (NOTE) INR goal varies based on device and disease states. Performed at [facility], [address] [place], [place] [address]   APTT     Status: None   Collection Time: 12/16/19 11:36 AM  Result Value Ref Range   aPTT 32 24 - 36 seconds    Comment: Performed at [facility], [address], [place], [place] [address]  CBC     Status: None   Collection Time: 12/16/19 11:36 AM  Result Value Ref Range   WBC 6.4 4.0 - 10.5 K/uL   RBC 4.68 3.87 - 5.11 MIL/uL   Hemoglobin 13.9 12.0 - 15.0 g/dL   HCT 37.1 36 - 46 %   MCV 85.3 80.0 - 100.0 fL   MCH 29.7 26.0 - 34.0 pg   MCHC 34.8 30.0 - 36.0 g/dL   RDW 69.6 78.9 - 38.1 %   Platelets 317 150 - 400 K/uL   nRBC 0.0 0.0 - 0.2 %    Comment: Performed at [facility], [address] [place], [place] [address]  Differential     Status: None   Collection Time: 12/16/19 11:36 AM  Result Value Ref Range   Neutrophils Relative % 64 %   Neutro Abs 4.0 1.7 - 7.7 K/uL   Lymphocytes  Relative 30 %   Lymphs Abs 1.9 0.7 - 4.0 K/uL   Monocytes Relative 5 %   Monocytes Absolute 0.3 0.1 - 1.0 K/uL   Eosinophils Relative 1 %   Eosinophils Absolute 0.1 0.0 - 0.5 K/uL   Basophils Relative 0 %   Basophils Absolute 0.0 0.0 - 0.1 K/uL   Immature Granulocytes 0 %   Abs Immature Granulocytes 0.01 0.00 - 0.07 K/uL    Comment: Performed at [facility], [address] [place], [place] [address]  Comprehensive metabolic panel     Status: Abnormal   Collection Time: 12/16/19 11:36 AM  Result Value Ref Range   Sodium 138 135 - 145 mmol/L   Potassium 4.0 3.5 - 5.1 mmol/L   Chloride 106 98 - 111 mmol/L   CO2 24 22 - 32 mmol/L   Glucose, Bld 89 70 - 99 mg/dL    Comment: Glucose reference range applies only to samples taken after fasting for at least 8 hours.   BUN 11 6 - 20 mg/dL   Creatinine, Ser 2.77 0.44 - 1.00 mg/dL   Calcium 8.8 (L) 8.9 - 10.3 mg/dL   Total Protein 8.1 6.5 - 8.1 g/dL   Albumin 4.1 3.5 - 5.0 g/dL   AST 19 15 - 41 U/L   ALT 17 0 - 44 U/L   Alkaline Phosphatase 65 38 - 126 U/L   Total Bilirubin 0.8 0.3 - 1.2 mg/dL   GFR, Estimated >82 >42 mL/min    Comment: (NOTE) Calculated using the CKD-EPI Creatinine Equation (2021)    Anion gap 8 5 - 15    Comment: Performed at [facility], [address], [place], [place] [address]   CT HEAD CODE STROKE WO CONTRAST  Result Date: 12/16/2019 CLINICAL DATA:  Code stroke. Acute neuro deficit. Left leg numbness. EXAM: CT HEAD WITHOUT CONTRAST TECHNIQUE: Contiguous axial images were obtained from the base of the skull through the vertex without intravenous contrast. COMPARISON:  None. FINDINGS: Brain: No evidence of acute infarction, hemorrhage, hydrocephalus, extra-axial collection or mass lesion/mass effect. Bilateral basal ganglia calcification. Vascular: Negative for hyperdense vessel Skull: Negative Sinuses/Orbits: Paranasal sinuses clear.  Negative orbit Other: None ASPECTS (Alberta Stroke Program  Early CT Score) - Ganglionic level infarction (caudate, lentiform nuclei, internal capsule, insula, M1-M3 cortex): 7 - Supraganglionic infarction (M4-M6 cortex): 3 Total score (0-10 with 10 being normal): 10 IMPRESSION: 1. Normal CT head 2. ASPECTS is 10 3. These results were called by telephone at the time of interpretation on 12/16/2019 at 11:33 am to provider [facility] , who verbally acknowledged  these results. Electronically Signed   By: Marlan Palauharles  Cromley M.D.   On: 12/16/2019 11:33   CT ANGIO HEAD CODE STROKE  Result Date: 12/16/2019 CLINICAL DATA:  Acute neuro deficit.  Rule out stroke. EXAM: CT ANGIOGRAPHY HEAD AND NECK TECHNIQUE: Multidetector CT imaging of the head and neck was performed using the standard protocol during bolus administration of intravenous contrast. Multiplanar CT image reconstructions and MIPs were obtained to evaluate the vascular anatomy. Carotid stenosis measurements (when applicable) are obtained utilizing NASCET criteria, using the distal internal carotid diameter as the denominator. CONTRAST:  75mL OMNIPAQUE IOHEXOL 350 MG/ML SOLN COMPARISON:  CT head 12/16/2019 FINDINGS: CTA NECK FINDINGS Aortic arch: Standard branching. Imaged portion shows no evidence of aneurysm or dissection. No significant stenosis of the major arch vessel origins. Normal aortic arch Right carotid system: Normal right carotid. Negative for atherosclerotic disease or dissection. Left carotid system: Normal left carotid. Negative for atherosclerotic disease or dissection Vertebral arteries: Normal vertebral arteries bilaterally. Negative for atherosclerotic disease or dissection. Skeleton: Negative Other neck: Negative Upper chest: Lung apices clear bilaterally. Review of the MIP images confirms the above findings CTA HEAD FINDINGS Anterior circulation: Cavernous carotid normal bilaterally without stenosis or atherosclerotic disease. Anterior and middle cerebral arteries normal bilaterally without  stenosis or large vessel occlusion. Posterior circulation: Both vertebral arteries patent to the basilar. No stenosis or large vessel occlusion in the posterior circulation. Venous sinuses: Normal venous enhancement. Anatomic variants: None Review of the MIP images confirms the above findings IMPRESSION: Normal CT angiogram head and neck. Electronically Signed   By: Marlan Palauharles  Miano M.D.   On: 12/16/2019 13:17   CT ANGIO NECK CODE STROKE  Result Date: 12/16/2019 CLINICAL DATA:  Acute neuro deficit.  Rule out stroke. EXAM: CT ANGIOGRAPHY HEAD AND NECK TECHNIQUE: Multidetector CT imaging of the head and neck was performed using the standard protocol during bolus administration of intravenous contrast. Multiplanar CT image reconstructions and MIPs were obtained to evaluate the vascular anatomy. Carotid stenosis measurements (when applicable) are obtained utilizing NASCET criteria, using the distal internal carotid diameter as the denominator. CONTRAST:  75mL OMNIPAQUE IOHEXOL 350 MG/ML SOLN COMPARISON:  CT head 12/16/2019 FINDINGS: CTA NECK FINDINGS Aortic arch: Standard branching. Imaged portion shows no evidence of aneurysm or dissection. No significant stenosis of the major arch vessel origins. Normal aortic arch Right carotid system: Normal right carotid. Negative for atherosclerotic disease or dissection. Left carotid system: Normal left carotid. Negative for atherosclerotic disease or dissection Vertebral arteries: Normal vertebral arteries bilaterally. Negative for atherosclerotic disease or dissection. Skeleton: Negative Other neck: Negative Upper chest: Lung apices clear bilaterally. Review of the MIP images confirms the above findings CTA HEAD FINDINGS Anterior circulation: Cavernous carotid normal bilaterally without stenosis or atherosclerotic disease. Anterior and middle cerebral arteries normal bilaterally without stenosis or large vessel occlusion. Posterior circulation: Both vertebral arteries patent  to the basilar. No stenosis or large vessel occlusion in the posterior circulation. Venous sinuses: Normal venous enhancement. Anatomic variants: None Review of the MIP images confirms the above findings IMPRESSION: Normal CT angiogram head and neck. Electronically Signed   By: Marlan Palauharles  Klutts M.D.   On: 12/16/2019 13:17    Review of Systems  A 10 point review of systems was performed and it is as noted above otherwise negative.  Blood pressure 133/82, pulse (!) 101, temperature 97.8 F (36.6 C), temperature source Oral, resp. rate 20, height 5\' 1"  (1.549 m), weight 71.5 kg, last menstrual period 12/15/2019, SpO2 100 %, not currently  breastfeeding. Physical Exam  GENERAL: Well-developed well-nourished woman, laying on stretcher, no acute distress, awake alert oriented x4. HEAD: Normocephalic, atraumatic.  No facial droop or asymmetry  EYES: Pupils equal, round, reactive to light.  No scleral icterus. MOUTH: Pharynx clear, oral mucosa moist. NECK: Supple. No thyromegaly. Trachea midline. No JVD.  No adenopathy. PULMONARY: Good air entry bilaterally.  No adventitious sounds. CARDIOVASCULAR: S1 and S2. Regular rate and rhythm.  ABDOMEN: Nondistended, normoactive bowel sounds, no tenderness, no hepatosplenomegaly. MUSCULOSKELETAL: No joint deformity, no clubbing, no edema.  NEUROLOGIC: Post TPA exam is intact with symmetrical strength on upper and lower extremities. SKIN: Intact,warm,dry. PSYCH: Mood and behavior normal.  Assessment/Plan  Possible stroke DDx: Atypical migraine, MS Patient received TPA Neurologic improvement post TPA Patient will be observed on post TPA protocol in ICU CT scan/MRI follow-up per neurology Obtain 2D echo to evaluate for potential stroke source Check thyroid functions NPO. for now until has swallow assessment  Tachycardia Likely physiologic response to stress/anxiety No hemodynamic instability Monitor    C. Danice Goltz, MD [place] [facility] 12/16/2019,  1:39 PM  *This note was dictated using voice recognition software/Dragon.  Despite best efforts to proofread, errors can occur which can change the meaning.  Any change was purely unintentional.

## 2019-12-16 NOTE — Progress Notes (Signed)
eLink Physician-Brief Progress Note Patient Name: Gabriela Davidson DOB: [birth year] MRN: [id number]   Date of Service  12/16/2019  HPI/Events of Note  24 yr old female with hx of pre eclampsia in past presented with left sided weakness. CTH neg. Neurology - s/p tPA. CTA Head, neck neg for LVO.  Data: Reviewed Labs are ok.Covid, flu neg. Preg:  TSH 0.73 MRI no infarction.  Camera: Appear fine. Comfortable. Passed swallow evaluation. HR 82. 100% sats. MAP is fine.  As per patient, she feeling better, left weakness improved. No hx of clots or stroke in the family  1. Acute Ischemic CVA.  S/p tPA. - hypercoagulable work up. - neuro checks. - aspiration and sz precaution - TTE in AM - follow CTH in 24 hrs. - BG goals < 180 - SCD as VTE prophylaxis for now. - avoid hypertension. - on colace/clevidepine, ppi  eICU Interventions  As above. Ordering hypercoaguble work up.      Intervention Category Major Interventions: Other: Evaluation Type: New Patient Evaluation  Ranee Gosselin 12/16/2019, 10:11 PM

## 2019-12-16 NOTE — ED Triage Notes (Signed)
Pt arrived via POV with reports of L leg numbness that started over the past couple of days, pt also reports that L arm started getting numb around 10am. Pt states when she woke up this morning she did not have numbness in the L leg, but states after she was walking around making breakfast around 9 am she had numbness in L leg.  Pt also reports that in the L eye she also had some blurry spots and seeing black spots.  Pt has hx of pre-eclampsia last year.  Pt reports frontal HA that started today as well.

## 2019-12-16 NOTE — ED Notes (Signed)
Pt states L sided cheek sensation is the same at this time.

## 2019-12-16 NOTE — ED Notes (Signed)
CVA   Gilles Chiquito, MD 12/16/19 1244

## 2019-12-16 NOTE — ED Notes (Signed)
Pt at MRI, no changes in condition at this time. VSS.

## 2019-12-16 NOTE — ED Provider Notes (Signed)
[facility] Emergency Department Provider Note  ____________________________________________   First MD Initiated Contact with Patient 12/16/19 1144     (approximate)  I have reviewed the triage vital signs and the nursing notes.   HISTORY  Chief Complaint Code Stroke   HPI Gabriela Davidson is a 24 y.o. female without significant past medical history who presents for assessment of some paresthesias and "numbness" in her left arm and left leg as well as some left peripheral field vision changes that she noticed around 9 AM today.  Patient states she noticed some tingling in her left arm last night but this was not as noticeable as her symptoms today and resolved on its own.  She states she still has some tingling in her left arm but the tingling her left leg is improved and she thinks her vision is back to baseline.  Depression episodes.  No clear alleviating aggravating precipitating events.  Patient denies any recent traumatic injuries or falls.  She denies any weakness, vertigo, chest pain, cough, shortness of breath, headache, earache, abdominal pain, vomiting, diarrhea, dysuria, rash or other acute complaints.  Denies EtOH or illicit drug use.         Past Medical History:  Diagnosis Date  . Pre-eclampsia     Patient Active Problem List   Diagnosis Date Noted  . Gestational hypertension, third trimester 12/28/2018    History reviewed. No pertinent surgical history.  Prior to Admission medications   Medication Sig Start Date End Date Taking? Authorizing Provider  desloratadine (CLARINEX) 5 MG tablet Take 5 mg by mouth daily.   Yes [provider]  spironolactone (ALDACTONE) 50 MG tablet Take 50 mg by mouth 2 (two) times daily.   Yes [provider]    Allergies Patient has no known allergies.  History reviewed. No pertinent family history.  Social History Social History   Tobacco Use  . Smoking status: Never Smoker  .  Smokeless tobacco: Never Used  Vaping Use  . Vaping Use: Never used  Substance Use Topics  . Alcohol use: Never  . Drug use: Never    Review of Systems  Review of Systems  Constitutional: Negative for chills and fever.  HENT: Negative for sore throat.   Eyes: Negative for blurred vision, double vision and pain.  Respiratory: Negative for cough and stridor.   Cardiovascular: Negative for chest pain.  Gastrointestinal: Negative for vomiting.  Genitourinary: Negative for dysuria.  Musculoskeletal: Negative for myalgias.  Skin: Negative for rash.  Neurological: Positive for sensory change. Negative for focal weakness, seizures, loss of consciousness and headaches.  Psychiatric/Behavioral: Negative for suicidal ideas.  All other systems reviewed and are negative.     ____________________________________________   PHYSICAL EXAM:  VITAL SIGNS: ED Triage Vitals  Enc Vitals Group     BP 12/16/19 1111 (!) 145/89     Pulse Rate 12/16/19 1111 79     Resp 12/16/19 1111 18     Temp 12/16/19 1111 97.8 F (36.6 C)     Temp Source 12/16/19 1111 Oral     SpO2 12/16/19 1111 100 %     Weight 12/16/19 1111 147 lb (66.7 kg)     Height 12/16/19 1111 5\' 1"  (1.549 m)     Head Circumference --      Peak Flow --      Pain Score 12/16/19 1110 0     Pain Loc --      Pain Edu? --  Excl. in GC? --    Vitals:   12/16/19 1111  BP: (!) 145/89  Pulse: 79  Resp: 18  Temp: 97.8 F (36.6 C)  SpO2: 100%   Physical Exam Vitals and nursing note reviewed.  Constitutional:      General: She is not in acute distress.    Appearance: She is well-developed.  HENT:     Head: Normocephalic and atraumatic.     Right Ear: External ear normal.     Left Ear: External ear normal.     Nose: Nose normal.  Eyes:     Conjunctiva/sclera: Conjunctivae normal.  Cardiovascular:     Rate and Rhythm: Normal rate and regular rhythm.     Heart sounds: No murmur heard.   Pulmonary:     Effort: Pulmonary  effort is normal. No respiratory distress.     Breath sounds: Normal breath sounds.  Abdominal:     Palpations: Abdomen is soft.     Tenderness: There is no abdominal tenderness.  Musculoskeletal:     Cervical back: Neck supple.  Skin:    General: Skin is warm and dry.     Capillary Refill: Capillary refill takes less than 2 seconds.  Neurological:     Mental Status: She is alert and oriented to person, place, and time.  Psychiatric:        Mood and Affect: Mood normal.     Cranial nerves II through XII grossly intact.  No pronator drift.  No finger dysmetria.  Symmetric 5/5 strength of all extremities.  Sensation intact to light touch in all extremities.  Unremarkable unassisted gait. ____________________________________________   LABS (all labs ordered are listed, but only abnormal results are displayed)  Labs Reviewed  COMPREHENSIVE METABOLIC PANEL - Abnormal; Notable for the following components:      Result Value   Calcium 8.8 (*)    All other components within normal limits  RESP PANEL BY RT-PCR (FLU A&B, COVID) ARPGX2  PROTIME-INR  APTT  CBC  DIFFERENTIAL  POC URINE PREG, ED  CBG MONITORING, ED   ____________________________________________  EKG  Sinus rhythm with a ventricular rate of 75, normal axis, unremarkable, no evidence of acute ischemia or other significant Arrhythmia. ____________________________________________  RADIOLOGY  ED MD interpretation: No evidence of intracranial hemorrhage or other acute intracranial process.  Official radiology report(s): CT HEAD CODE STROKE WO CONTRAST  Result Date: 12/16/2019 CLINICAL DATA:  Code stroke. Acute neuro deficit. Left leg numbness. EXAM: CT HEAD WITHOUT CONTRAST TECHNIQUE: Contiguous axial images were obtained from the base of the skull through the vertex without intravenous contrast. COMPARISON:  None. FINDINGS: Brain: No evidence of acute infarction, hemorrhage, hydrocephalus, extra-axial collection or  mass lesion/mass effect. Bilateral basal ganglia calcification. Vascular: Negative for hyperdense vessel Skull: Negative Sinuses/Orbits: Paranasal sinuses clear.  Negative orbit Other: None ASPECTS (Alberta Stroke Program Early CT Score) - Ganglionic level infarction (caudate, lentiform nuclei, internal capsule, insula, M1-M3 cortex): 7 - Supraganglionic infarction (M4-M6 cortex): 3 Total score (0-10 with 10 being normal): 10 IMPRESSION: 1. Normal CT head 2. ASPECTS is 10 3. These results were called by telephone at the time of interpretation on 12/16/2019 at 11:33 am to provider [facility] , who verbally acknowledged these results. Electronically Signed   By: Marlan Palau M.D.   On: 12/16/2019 11:33    ____________________________________________   PROCEDURES  Procedure(s) performed (including Critical Care):  .Critical Care Performed by: Gilles Chiquito, MD Authorized by: Gilles Chiquito, MD   Critical care  provider statement:    Critical care time (minutes):  45   Critical care time was exclusive of:  Separately billable procedures and treating other patients   Critical care was necessary to treat or prevent imminent or life-threatening deterioration of the following conditions:  CNS failure or compromise   Critical care was time spent personally by me on the following activities:  Discussions with consultants, evaluation of patient's response to treatment, examination of patient, ordering and performing treatments and interventions, ordering and review of laboratory studies, ordering and review of radiographic studies, pulse oximetry, re-evaluation of patient's condition, obtaining history from patient or surrogate and review of old charts     ____________________________________________   INITIAL IMPRESSION / ASSESSMENT AND PLAN / ED COURSE        Patient presents with a stage exam for assessment of left-sided sensory symptoms that she felt last night but resolved and  returned again this morning around 9 AM or associate with some "fuzziness" in her left peripheral visual field.  On arrival she states her vision is back to baseline and she only has some sensory symptoms in her left arm still.  On arrival he is hypertensive with a BP of 145/89 otherwise stable vital signs on room air.  Exam as above remarkable for no focal neurologic deficits.  NIH stroke scale is 0.  Given onset of symptoms including paresthesias and visual field symptoms with illness 6 hours code stroke initiated on patient arrival.  No evidence of intracranial hemorrhage on CT.  Overall history exam is not consistent with acute traumatic injury, acute infectious process, toxic ingestion or intoxication.  CBC is unremarkable and CMP is no significant ocular metabolic derangements.  Patient seen immediately by Dr. Otelia Limes.  Please see his note for full details regarding his evaluation assessment.  In brief he is treated with concern for ischemic stroke and recommended TPA.  tPA given in the ED.  Patient will be admitted to medical ICU for further evaluation and monitoring.   ____________________________________________   FINAL CLINICAL IMPRESSION(S) / ED DIAGNOSES  Final diagnoses:  Acute ischemic stroke (HCC)    Medications  alteplase (ACTIVASE) 1 mg/mL infusion 64.4 mg (has no administration in time range)    Followed by  0.9 %  sodium chloride infusion (has no administration in time range)  clevidipine (CLEVIPREX) infusion 0.5 mg/mL (has no administration in time range)  iohexol (OMNIPAQUE) 350 MG/ML injection 75 mL (has no administration in time range)     ED Discharge Orders    None       Note:  This document was prepared using Dragon voice recognition software and may include unintentional dictation errors.   Gilles Chiquito, MD 12/16/19 [phone]

## 2019-12-16 NOTE — Consult Note (Addendum)
Referring Physician: Dr. Katrinka Blazing    Chief Complaint: Acute onset of left sided weakness and numbness  HPI: Gabriela Davidson is an 24 y.o. female presenting to the ED from home with acute left sided weakness and numbness. She first had symptoms last night of numbness to the posterior aspect of her left thigh. The symptoms resolved after a few minutes. This AM, she was up drinking coffee, feeling normal, when she acutely developed numbness to the anterior aspect of her left leg below the knee at 0900, which spread to involve her entire foot, the back of her lower leg and back of her thigh. She also experienced vision loss to the temporal hemifield of her left eye. She has never had any such episodes before, prior to her symptoms last night. She denies weakness, facial droop, facial numbness, slurred speech, confusion, difficulty speaking or headache. No SOB or CP. No F/C or abdominal symptoms.   She has a PMHx of pre-eclampsia. No history of stroke, ICH, intracranial lesion, head trauma, seizures or MS. No family history of MS. Not hypertensive at baseline, other than with her prior episode of pre-eclampsia. She is currently breastfeeding.   LSN: [id number] tPA Given: Yes NIHSS: 1 for sensory deficit  Past Medical History:  Diagnosis Date  . Pre-eclampsia     History reviewed. No pertinent surgical history.  History reviewed. No pertinent family history. Social History:  reports that she has never smoked. She has never used smokeless tobacco. She reports that she does not drink alcohol and does not use drugs.  Allergies: No Known Allergies  Medications: No current facility-administered medications on file prior to encounter.   Current Outpatient Medications on File Prior to Encounter  Medication Sig Dispense Refill  . desloratadine (CLARINEX) 5 MG tablet Take 5 mg by mouth daily.    Marland Kitchen spironolactone (ALDACTONE) 50 MG tablet Take 50 mg by mouth 2 (two) times daily.     ROS: As per HPI.  Comprehensive ROS otherwise negative.   Physical Examination: Blood pressure (!) 164/113, pulse (!) 119, temperature 97.8 F (36.6 C), temperature source Oral, resp. rate 20, height 5\' 1"  (1.549 m), weight 71.5 kg, last menstrual period 12/15/2019, SpO2 100 %, not currently breastfeeding.  HEENT: [place]/AT Lungs: Respirations unlabored Ext: No edema  Neurologic Examination: Mental Status: Alert, fully oriented, thought content appropriate in the context of current situation, with anxious and tearful affect at times.  Speech fluent with intact comprehension and naming. No dysarthria.  Cranial Nerves: II:  Visual fields intact in all 4 quadrants of each eye tested individually. No extinction to DSS. PERRL.  III,IV, VI: No ptosis. EOMI. No nystagmus.   V,VII: Smile symmetric, facial temp sensation equal bilaterally. Slight decreased FT sensation on the left.  VIII: Hearing intact to questions and commands IX,X: No hoarseness or hypophonia XI: Symmetric  XII: Midline tongue extension  Motor: RUE and RLE 5/5 proximally and distally LUE 4+/5 proximally and distally  LLE 4+/5 proximally and distally except for 5/5 APF Sensory: Decreased FT to entire lower left leg below the knee, as well as posterior and lateral left thigh sparing the anterior aspect. Decreased temp sensation to LLE.  Normal FT and temp to RLE and RUE Hyperesthesia to temp, left forearm. Decreased FT sensation to left forearm.  No extinction to DSS.  Deep Tendon Reflexes:  2+ bilateral brachioradialis, biceps, patellae and achilles.  Plantars: Right: downgoing  Left: downgoing Cerebellar: No ataxia with FNF bilaterally  Gait: Deferred in the context of  acuity of presentation.   Results for orders placed or performed during the hospital encounter of 12/16/19 (from the past 48 hour(s))  Protime-INR     Status: None   Collection Time: 12/16/19 11:36 AM  Result Value Ref Range   Prothrombin Time 13.4 11.4 - 15.2 seconds   INR  1.1 0.8 - 1.2    Comment: (NOTE) INR goal varies based on device and disease states. Performed at [facility], [address] [place], [place] [address]   APTT     Status: None   Collection Time: 12/16/19 11:36 AM  Result Value Ref Range   aPTT 32 24 - 36 seconds    Comment: Performed at [facility], [address] [place], [place] [address]  CBC     Status: None   Collection Time: 12/16/19 11:36 AM  Result Value Ref Range   WBC 6.4 4.0 - 10.5 K/uL   RBC 4.68 3.87 - 5.11 MIL/uL   Hemoglobin 13.9 12.0 - 15.0 g/dL   HCT 62.2 36 - 46 %   MCV 85.3 80.0 - 100.0 fL   MCH 29.7 26.0 - 34.0 pg   MCHC 34.8 30.0 - 36.0 g/dL   RDW 63.3 35.4 - 56.2 %   Platelets 317 150 - 400 K/uL   nRBC 0.0 0.0 - 0.2 %    Comment: Performed at [facility], [address] [place], [place] [address]  Differential     Status: None   Collection Time: 12/16/19 11:36 AM  Result Value Ref Range   Neutrophils Relative % 64 %   Neutro Abs 4.0 1.7 - 7.7 K/uL   Lymphocytes Relative 30 %   Lymphs Abs 1.9 0.7 - 4.0 K/uL   Monocytes Relative 5 %   Monocytes Absolute 0.3 0.1 - 1.0 K/uL   Eosinophils Relative 1 %   Eosinophils Absolute 0.1 0.0 - 0.5 K/uL   Basophils Relative 0 %   Basophils Absolute 0.0 0.0 - 0.1 K/uL   Immature Granulocytes 0 %   Abs Immature Granulocytes 0.01 0.00 - 0.07 K/uL    Comment: Performed at [facility], [address] [place], [place] [address]  Comprehensive metabolic panel     Status: Abnormal   Collection Time: 12/16/19 11:36 AM  Result Value Ref Range   Sodium 138 135 - 145 mmol/L   Potassium 4.0 3.5 - 5.1 mmol/L   Chloride 106 98 - 111 mmol/L   CO2 24 22 - 32 mmol/L   Glucose, Bld 89 70 - 99 mg/dL    Comment: Glucose reference range applies only to samples taken after fasting for at least 8 hours.   BUN 11 6 - 20 mg/dL   Creatinine, Ser 8.76 0.44 - 1.00 mg/dL   Calcium 8.8 (L) 8.9 - 10.3 mg/dL   Total Protein 8.1 6.5 - 8.1 g/dL    Albumin 4.1 3.5 - 5.0 g/dL   AST 19 15 - 41 U/L   ALT 17 0 - 44 U/L   Alkaline Phosphatase 65 38 - 126 U/L   Total Bilirubin 0.8 0.3 - 1.2 mg/dL   GFR, Estimated >81 >15 mL/min    Comment: (NOTE) Calculated using the CKD-EPI Creatinine Equation (2021)    Anion gap 8 5 - 15    Comment: Performed at [facility], [address] [place], [place] [address]   CT HEAD CODE STROKE WO CONTRAST  Result Date: 12/16/2019 CLINICAL DATA:  Code stroke. Acute neuro deficit. Left leg  numbness. EXAM: CT HEAD WITHOUT CONTRAST TECHNIQUE: Contiguous axial images were obtained from the base of the skull through the vertex without intravenous contrast. COMPARISON:  None. FINDINGS: Brain: No evidence of acute infarction, hemorrhage, hydrocephalus, extra-axial collection or mass lesion/mass effect. Bilateral basal ganglia calcification. Vascular: Negative for hyperdense vessel Skull: Negative Sinuses/Orbits: Paranasal sinuses clear.  Negative orbit Other: None ASPECTS (Alberta Stroke Program Early CT Score) - Ganglionic level infarction (caudate, lentiform nuclei, internal capsule, insula, M1-M3 cortex): 7 - Supraganglionic infarction (M4-M6 cortex): 3 Total score (0-10 with 10 being normal): 10 IMPRESSION: 1. Normal CT head 2. ASPECTS is 10 3. These results were called by telephone at the time of interpretation on 12/16/2019 at 11:33 am to provider [facility] , who verbally acknowledged these results. Electronically Signed   By: Marlan Palau M.D.   On: 12/16/2019 11:33    Assessment: 24 y.o. female presenting with acute onset of left sided sensory numbness. Exam also reveals LUE and LLE weakness.  1. Overall clinical picture presents with high suspicion for acute ischemic stroke. Other DDx includes new onset MS and migraine accompaniment (complicated migraine symptoms without headache).  2. CT head normal.  3. Stroke Risk Factors - None 4. After comprehensive review of possible contraindications,  she has no absolute contraindications to tPA administration. Patient is a tPA candidate. Discussed extensively the risks/benefits of tPA treatment vs. no treatment with the patient, including risks of hemorrhage and death with tPA administration versus worse overall outcomes on average in patients within tPA time window who are not administered tPA. Overall benefits of tPA regarding long-term prognosis are felt to outweigh risks. The patient expressed understanding and wish to proceed with tPA.   Recommendations: 1. STAT CTA of head and neck has been ordered.  2. MRI of the brain and cervical spine with and without contrast 3. PT consult, OT consult, Speech consult 4. TTE 5. HgbA1c, fasting lipid panel 6. Telemetry monitoring 7. Frequent neuro checks 8. ICU team is admitting for 24 hour close monitoring. If repeat CT head at 24 hours is negative for hemorrhagic conversion and she is clinically stable, can transfer to the medical floor at that time.  9. Post-tPA order set to include frequent neuro checks and BP management.  10. No antiplatelet medications or anticoagulants for at least 24 hours following tPA.  11. DVT prophylaxis with SCDs.  12. NPO until passes swallow evaluation.  13. Telemetry monitoring 14. Fasting lipid panel, HgbA1c 15. Hypercoagulable panel.   Addendum: CTA of head and neck is negative for LVO.    @Electronically  signed: Dr.  12/16/2019, 12:42 PM

## 2019-12-16 NOTE — ED Notes (Signed)
L sided weakness improving in legs, L arms still slightly weak, neoroligist at bedside. Dr. Jayme Cloud at bedside.

## 2019-12-16 NOTE — Consult Note (Signed)
CODE STROKE- PHARMACY COMMUNICATION   Time CODE STROKE called/page received: 1146  Time response to CODE STROKE was made (in person or via phone): 1155  Time Stroke Kit retrieved from [place] (only if needed): 1157  Name of Provider/Nurse contacted: Dr Cheral Marker - tpa mixed and given  Past Medical History:  Diagnosis Date  . Pre-eclampsia    Prior to Admission medications   Medication Sig Start Date End Date Taking? Authorizing Provider  acetaminophen (TYLENOL) 325 MG tablet Take 2 tablets (650 mg total) by mouth every 4 (four) hours as needed (for pain scale < 4). 12/31/18   Minda Meo, CNM  coconut oil OIL Apply 1 application topically as needed. 12/31/18   Minda Meo, CNM  ferrous sulfate 325 (65 FE) MG tablet Take 1 tablet (325 mg total) by mouth 2 (two) times daily with a meal. 12/31/18   Minda Meo, CNM  ibuprofen (ADVIL) 600 MG tablet Take 1 tablet (600 mg total) by mouth every 6 (six) hours. 12/31/18   Minda Meo, CNM  Prenatal Vit-Fe Fumarate-FA (PRENATAL MULTIVITAMIN) TABS tablet Take 1 tablet by mouth daily at 12 noon. 12/31/18   Minda Meo, [place] ,PharmD Clinical Pharmacist  12/16/2019  11:53 AM

## 2019-12-16 NOTE — ED Notes (Signed)
POC urine negative  

## 2019-12-16 NOTE — ED Notes (Signed)
Spoke to nurse Jacqui about Bed Ready.

## 2019-12-16 NOTE — ED Notes (Signed)
Pt states R Lower arms from about mid forearm is sore and R fingers are numb, ED MD aware.

## 2019-12-17 ENCOUNTER — Inpatient Hospital Stay: Payer: BC Managed Care – PPO

## 2019-12-17 ENCOUNTER — Inpatient Hospital Stay (HOSPITAL_COMMUNITY)
Admit: 2019-12-17 | Discharge: 2019-12-17 | Disposition: A | Discharge status: Home / Self Care | Payer: BC Managed Care – PPO | Attending: Pulmonary Disease | Admitting: Pulmonary Disease

## 2019-12-17 ENCOUNTER — Encounter: Payer: Self-pay | Admitting: Pulmonary Disease

## 2019-12-17 DIAGNOSIS — I6389 Other cerebral infarction: Secondary | ICD-10-CM | POA: Diagnosis not present

## 2019-12-17 DIAGNOSIS — I639 Cerebral infarction, unspecified: Secondary | ICD-10-CM | POA: Diagnosis not present

## 2019-12-17 DIAGNOSIS — R531 Weakness: Secondary | ICD-10-CM | POA: Diagnosis not present

## 2019-12-17 LAB — ECHOCARDIOGRAM COMPLETE
AR max vel: 2.66 cm2
AV Area VTI: 2.73 cm2
AV Area mean vel: 2.55 cm2
AV Mean grad: 3 mmHg
AV Peak grad: 5.5 mmHg
Ao pk vel: 1.17 m/s
Area-P 1/2: 4.89 cm2
Height: 61 in
S' Lateral: 2.62 cm
Weight: 2523.2 oz

## 2019-12-17 LAB — CBC
HCT: 35.7 % — ABNORMAL LOW (ref 36.0–46.0)
Hemoglobin: 12.7 g/dL (ref 12.0–15.0)
MCH: 30 pg (ref 26.0–34.0)
MCHC: 35.6 g/dL (ref 30.0–36.0)
MCV: 84.2 fL (ref 80.0–100.0)
Platelets: 302 10*3/uL (ref 150–400)
RBC: 4.24 MIL/uL (ref 3.87–5.11)
RDW: 12.6 % (ref 11.5–15.5)
WBC: 6.1 10*3/uL (ref 4.0–10.5)
nRBC: 0 % (ref 0.0–0.2)

## 2019-12-17 LAB — LIPID PANEL
Cholesterol: 122 mg/dL (ref 0–200)
HDL: 41 mg/dL (ref >40)
LDL Cholesterol: 73 mg/dL (ref 0–99)
Total CHOL/HDL Ratio: 3 RATIO
Triglycerides: 38 mg/dL (ref <150)
VLDL: 8 mg/dL (ref 0–40)

## 2019-12-17 LAB — BASIC METABOLIC PANEL
Anion gap: 7 (ref 5–15)
BUN: 12 mg/dL (ref 6–20)
CO2: 23 mmol/L (ref 22–32)
Calcium: 8.4 mg/dL — ABNORMAL LOW (ref 8.9–10.3)
Chloride: 107 mmol/L (ref 98–111)
Creatinine, Ser: 0.64 mg/dL (ref 0.44–1.00)
GFR, Estimated: >60 mL/min (ref >60)
Glucose, Bld: 85 mg/dL (ref 70–99)
Potassium: 3.5 mmol/L (ref 3.5–5.1)
Sodium: 137 mmol/L (ref 135–145)

## 2019-12-17 LAB — ANTITHROMBIN III: AntiThromb III Func: 95 % (ref 75–120)

## 2019-12-17 LAB — PHOSPHORUS: Phosphorus: 3.3 mg/dL (ref 2.5–4.6)

## 2019-12-17 LAB — HEMOGLOBIN A1C
Hgb A1c MFr Bld: 4.5 % — ABNORMAL LOW (ref 4.8–5.6)
Mean Plasma Glucose: 82.45 mg/dL

## 2019-12-17 LAB — MAGNESIUM: Magnesium: 2.3 mg/dL (ref 1.7–2.4)

## 2019-12-17 MED ORDER — GADOBUTROL 1 MMOL/ML IV SOLN
7.5000 mL | Freq: Once | INTRAVENOUS | Status: AC | PRN
  Administered 2019-12-17: 7.5 mL via INTRAVENOUS

## 2019-12-17 MED ORDER — ASPIRIN EC 81 MG PO TBEC: 81.0000 mg | DELAYED_RELEASE_TABLET | Freq: Every day | ORAL | 0 refills | Status: AC

## 2019-12-17 MED ORDER — CHLORHEXIDINE GLUCONATE CLOTH 2 % EX PADS: 6.0000 | MEDICATED_PAD | Freq: Every day | CUTANEOUS | Status: DC

## 2019-12-17 NOTE — Progress Notes (Signed)
Per Dr. Otelia Limes, Neuro at 1224- OK to perform CT earlier than scheduled time of 1500 in order to coordinate with travel plans to MRI.

## 2019-12-17 NOTE — Progress Notes (Addendum)
Review of lab data from today:  2D echo normal  MRI cervical spine normal  Repeat CT head 24 hours post TPA, normal  Has been cleared from PT standpoint, neurology states patient can be discharged to home starting aspirin 81 mg daily in the a.m. will need follow-up with [facility] neurology.  Will arrange for discharge.  Gailen Shelter, MD [facility]

## 2019-12-17 NOTE — Progress Notes (Signed)
*  PRELIMINARY RESULTS* Echocardiogram 2D Echocardiogram has been performed.  Cristela Blue 12/17/2019, 9:25 AM

## 2019-12-17 NOTE — Progress Notes (Addendum)
Subjective: The patient's left sided symptoms are resolved. TTE currently being performed.   Objective: Current vital signs: BP 105/74   Pulse 70   Temp 98.8 F (37.1 C) (Oral)   Resp 17   Ht 5\' 1"  (1.549 m)   Wt 71.5 kg   LMP 12/15/2019 (Exact Date)   SpO2 99%   Breastfeeding No   BMI 29.80 kg/m  Vital signs in last 24 hours: Temp:  [97.8 F (36.6 C)-98.8 F (37.1 C)] 98.8 F (37.1 C) (12/05 0400) Pulse Rate:  [62-119] 70 (12/05 0600) Resp:  [12-23] 17 (12/05 0600) BP: (92-164)/(54-113) 105/74 (12/05 0600) SpO2:  [98 %-100 %] 99 % (12/05 0600) Weight:  [66.7 kg-71.5 kg] 71.5 kg (12/04 1133)  Intake/Output from previous day: No intake/output data recorded. Intake/Output this shift: No intake/output data recorded. Nutritional status:  Diet Order            Diet regular Room service appropriate? Yes; Fluid consistency: Thin  Diet effective now                 HEENT: [place]/AT Lungs: Respirations unlabored.  Ext: No edema  Neurologic Examination: Mental Status: Alert, oriented with bright affect. Speech fluent with no evidence for aphasia. No dysarthria.   Cranial Nerves:  II:  Visual fields intact with no extinction to DSS. PERRL.  III,IV, VI: No ptosis. EOMI. No nystagmus.   V,VII: Smile symmetric, facial temp sensation equal bilaterally. VIII: Hearing intact to questions and commands IX,X: No hoarseness or hypophonia XI: Symmetric  XII: Midline tongue extension  Motor: BUE with 5/5 grip strength BLE 5/5 proximally and distally No asymmetry.  Sensory: FT and Temp sensation equal bilaterally  Deep Tendon Reflexes:  2+ bilateral brachioradialis, biceps and patellae  Cerebellar: No ataxia with FNF on the right. LUE under towel for TTE, so not tested.  Gait: Deferred    Lab Results: Results for orders placed or performed during the hospital encounter of 12/16/19 (from the past 48 hour(s))  Protime-INR     Status: None   Collection Time: 12/16/19 11:36 AM   Result Value Ref Range   Prothrombin Time 13.4 11.4 - 15.2 seconds   INR 1.1 0.8 - 1.2    Comment: (NOTE) INR goal varies based on device and disease states. Performed at [facility], [address] [place], [place] [address]   APTT     Status: None   Collection Time: 12/16/19 11:36 AM  Result Value Ref Range   aPTT 32 24 - 36 seconds    Comment: Performed at [facility], [address] [place], [place] [address]  CBC     Status: None   Collection Time: 12/16/19 11:36 AM  Result Value Ref Range   WBC 6.4 4.0 - 10.5 K/uL   RBC 4.68 3.87 - 5.11 MIL/uL   Hemoglobin 13.9 12.0 - 15.0 g/dL   HCT 84.639.9 36 - 46 %   MCV 85.3 80.0 - 100.0 fL   MCH 29.7 26.0 - 34.0 pg   MCHC 34.8 30.0 - 36.0 g/dL   RDW 96.212.7 95.211.5 - 84.115.5 %   Platelets 317 150 - 400 K/uL   nRBC 0.0 0.0 - 0.2 %    Comment: Performed at [facility], [address] [place], [place] [address]  Differential     Status: None   Collection Time: 12/16/19 11:36 AM  Result Value Ref Range   Neutrophils Relative % 64 %   Neutro Abs 4.0 1.7 -  7.7 K/uL   Lymphocytes Relative 30 %   Lymphs Abs 1.9 0.7 - 4.0 K/uL   Monocytes Relative 5 %   Monocytes Absolute 0.3 0.1 - 1.0 K/uL   Eosinophils Relative 1 %   Eosinophils Absolute 0.1 0.0 - 0.5 K/uL   Basophils Relative 0 %   Basophils Absolute 0.0 0.0 - 0.1 K/uL   Immature Granulocytes 0 %   Abs Immature Granulocytes 0.01 0.00 - 0.07 K/uL    Comment: Performed at [facility], [address] [place], [place] [address]  Comprehensive metabolic panel     Status: Abnormal   Collection Time: 12/16/19 11:36 AM  Result Value Ref Range   Sodium 138 135 - 145 mmol/L   Potassium 4.0 3.5 - 5.1 mmol/L   Chloride 106 98 - 111 mmol/L   CO2 24 22 - 32 mmol/L   Glucose, Bld 89 70 - 99 mg/dL    Comment: Glucose reference range applies only to samples taken after fasting for at least 8 hours.   BUN 11 6 - 20 mg/dL   Creatinine, Ser 1.30 0.44 - 1.00  mg/dL   Calcium 8.8 (L) 8.9 - 10.3 mg/dL   Total Protein 8.1 6.5 - 8.1 g/dL   Albumin 4.1 3.5 - 5.0 g/dL   AST 19 15 - 41 U/L   ALT 17 0 - 44 U/L   Alkaline Phosphatase 65 38 - 126 U/L   Total Bilirubin 0.8 0.3 - 1.2 mg/dL   GFR, Estimated >86 >57 mL/min    Comment: (NOTE) Calculated using the CKD-EPI Creatinine Equation (2021)    Anion gap 8 5 - 15    Comment: Performed at [facility], [address] [place], [place] [address]  TSH     Status: None   Collection Time: 12/16/19 11:36 AM  Result Value Ref Range   TSH 0.743 0.350 - 4.500 uIU/mL    Comment: Performed by a 3rd Generation assay with a functional sensitivity of <=0.01 uIU/mL. Performed at [facility], [address] [place], [place] [address]   T4, free     Status: None   Collection Time: 12/16/19 11:36 AM  Result Value Ref Range   Free T4 0.79 0.61 - 1.12 ng/dL    Comment: (NOTE) Biotin ingestion may interfere with free T4 tests. If the results are inconsistent with the TSH level, previous test results, or the clinical presentation, then consider biotin interference. If needed, order repeat testing after stopping biotin. Performed at [facility], [address] [place], [place] [address]   Resp Panel by RT-PCR (Flu A&B, Covid) Nasopharyngeal Swab     Status: None   Collection Time: 12/16/19  1:53 PM   Specimen: Nasopharyngeal Swab; Nasopharyngeal(NP) swabs in vial transport medium  Result Value Ref Range   SARS Coronavirus 2 by RT PCR NEGATIVE NEGATIVE    Comment: (NOTE) SARS-CoV-2 target nucleic acids are NOT DETECTED.  The SARS-CoV-2 RNA is generally detectable in upper respiratory specimens during the acute phase of infection. The lowest concentration of SARS-CoV-2 viral copies this assay can detect is 138 copies/mL. A negative result does not preclude SARS-Cov-2 infection and should not be used as the sole basis for treatment or other patient management decisions. A  negative result may occur with  improper specimen collection/handling, submission of specimen other than nasopharyngeal swab, presence of viral mutation(s) within the areas targeted by this assay, and inadequate number of viral copies(<138 copies/mL). A negative result must be combined with  clinical observations, patient history, and epidemiological information. The expected result is Negative.  Fact Sheet for Patients:  BloggerCourse.com  Fact Sheet for Healthcare Providers:  SeriousBroker.it  This test is no t yet approved or cleared by the [place] FDA and  has been authorized for detection and/or diagnosis of SARS-CoV-2 by FDA under an Emergency Use Authorization (EUA). This EUA will remain  in effect (meaning this test can be used) for the duration of the COVID-19 declaration under Section 564(b)(1) of the Act, 21 U.S.C.section 360bbb-3(b)(1), unless the authorization is terminated  or revoked sooner.       Influenza A by PCR NEGATIVE NEGATIVE   Influenza B by PCR NEGATIVE NEGATIVE    Comment: (NOTE) The Xpert Xpress SARS-CoV-2/FLU/RSV plus assay is intended as an aid in the diagnosis of influenza from Nasopharyngeal swab specimens and should not be used as a sole basis for treatment. Nasal washings and aspirates are unacceptable for Xpert Xpress SARS-CoV-2/FLU/RSV testing.  Fact Sheet for Patients: BloggerCourse.com  Fact Sheet for Healthcare Providers: SeriousBroker.it  This test is not yet approved or cleared by the [place] FDA and has been authorized for detection and/or diagnosis of SARS-CoV-2 by FDA under an Emergency Use Authorization (EUA). This EUA will remain in effect (meaning this test can be used) for the duration of the COVID-19 declaration under Section 564(b)(1) of the Act, 21 U.S.C. section 360bbb-3(b)(1), unless the authorization is terminated  or revoked.  Performed at [facility], [address] [place], [place] [address]   Glucose, capillary     Status: Abnormal   Collection Time: 12/16/19 10:01 PM  Result Value Ref Range   Glucose-Capillary 69 (L) 70 - 99 mg/dL    Comment: Glucose reference range applies only to samples taken after fasting for at least 8 hours.  MRSA PCR Screening     Status: None   Collection Time: 12/16/19 10:13 PM   Specimen: Nasopharyngeal  Result Value Ref Range   MRSA by PCR NEGATIVE NEGATIVE    Comment:        The GeneXpert MRSA Assay (FDA approved for NASAL specimens only), is one component of a comprehensive MRSA colonization surveillance program. It is not intended to diagnose MRSA infection nor to guide or monitor treatment for MRSA infections. Performed at [facility], [address] [place], [place] [address]   hCG, quantitative, pregnancy     Status: None   Collection Time: 12/16/19 10:43 PM  Result Value Ref Range   hCG, Beta Chain, Quant, S <1 <5 mIU/mL    Comment:          GEST. AGE      CONC.  (mIU/mL)   <=1 WEEK        5 - 50     2 WEEKS       50 - 500     3 WEEKS       100 - 10,000     4 WEEKS     1,000 - 30,000     5 WEEKS     3,500 - 115,000   6-8 WEEKS     12,000 - 270,000    12 WEEKS     15,000 - 220,000        FEMALE AND NON-PREGNANT FEMALE:     LESS THAN 5 mIU/mL Performed at [facility], [address] [place], [place] [address]   CBC     Status: Abnormal   Collection Time: 12/17/19  4:35  AM  Result Value Ref Range   WBC 6.1 4.0 - 10.5 K/uL   RBC 4.24 3.87 - 5.11 MIL/uL   Hemoglobin 12.7 12.0 - 15.0 g/dL   HCT 81.0 (L) 36 - 46 %   MCV 84.2 80.0 - 100.0 fL   MCH 30.0 26.0 - 34.0 pg   MCHC 35.6 30.0 - 36.0 g/dL   RDW 17.5 10.2 - 58.5 %   Platelets 302 150 - 400 K/uL   nRBC 0.0 0.0 - 0.2 %    Comment: Performed at [facility], [address], [place], [place] [address]  Basic metabolic panel     Status:  Abnormal   Collection Time: 12/17/19  4:35 AM  Result Value Ref Range   Sodium 137 135 - 145 mmol/L   Potassium 3.5 3.5 - 5.1 mmol/L   Chloride 107 98 - 111 mmol/L   CO2 23 22 - 32 mmol/L   Glucose, Bld 85 70 - 99 mg/dL    Comment: Glucose reference range applies only to samples taken after fasting for at least 8 hours.   BUN 12 6 - 20 mg/dL   Creatinine, Ser 4.23 0.44 - 1.00 mg/dL   Calcium 8.4 (L) 8.9 - 10.3 mg/dL   GFR, Estimated >53 >61 mL/min    Comment: (NOTE) Calculated using the CKD-EPI Creatinine Equation (2021)    Anion gap 7 5 - 15    Comment: Performed at [facility], [address] [place], [place] [address]  Magnesium     Status: None   Collection Time: 12/17/19  4:35 AM  Result Value Ref Range   Magnesium 2.3 1.7 - 2.4 mg/dL    Comment: Performed at [facility], [address], [place], [place] [address]  Phosphorus     Status: None   Collection Time: 12/17/19  4:35 AM  Result Value Ref Range   Phosphorus 3.3 2.5 - 4.6 mg/dL    Comment: Performed at [place] [facility], [address] [place], [place] [address]  Lipid panel     Status: None   Collection Time: 12/17/19  4:35 AM  Result Value Ref Range   Cholesterol 122 0 - 200 mg/dL   Triglycerides 38 <093 mg/dL   HDL 41 >26 mg/dL   Total CHOL/HDL Ratio 3.0 RATIO   VLDL 8 0 - 40 mg/dL   LDL Cholesterol 73 0 - 99 mg/dL    Comment:        Total Cholesterol/HDL:CHD Risk Coronary Heart Disease Risk Table                     Men   Women  1/2 Average Risk   3.4   3.3  Average Risk       5.0   4.4  2 X Average Risk   9.6   7.1  3 X Average Risk  23.4   11.0        Use the calculated Patient Ratio above and the CHD Risk Table to determine the patient's CHD Risk.        ATP III CLASSIFICATION (LDL):  <100     mg/dL   Optimal  712-458  mg/dL   Near or Above                    Optimal  130-159  mg/dL   Borderline  099-833  mg/dL   High  >825     mg/dL   Very High Performed  at  [facility], [address], [place], [place] [address]     Recent Results (from the past 240 hour(s))  Resp Panel by RT-PCR (Flu A&B, Covid) Nasopharyngeal Swab     Status: None   Collection Time: 12/16/19  1:53 PM   Specimen: Nasopharyngeal Swab; Nasopharyngeal(NP) swabs in vial transport medium  Result Value Ref Range Status   SARS Coronavirus 2 by RT PCR NEGATIVE NEGATIVE Final    Comment: (NOTE) SARS-CoV-2 target nucleic acids are NOT DETECTED.  The SARS-CoV-2 RNA is generally detectable in upper respiratory specimens during the acute phase of infection. The lowest concentration of SARS-CoV-2 viral copies this assay can detect is 138 copies/mL. A negative result does not preclude SARS-Cov-2 infection and should not be used as the sole basis for treatment or other patient management decisions. A negative result may occur with  improper specimen collection/handling, submission of specimen other than nasopharyngeal swab, presence of viral mutation(s) within the areas targeted by this assay, and inadequate number of viral copies(<138 copies/mL). A negative result must be combined with clinical observations, patient history, and epidemiological information. The expected result is Negative.  Fact Sheet for Patients:  BloggerCourse.com  Fact Sheet for Healthcare Providers:  SeriousBroker.it  This test is no t yet approved or cleared by the [place] FDA and  has been authorized for detection and/or diagnosis of SARS-CoV-2 by FDA under an Emergency Use Authorization (EUA). This EUA will remain  in effect (meaning this test can be used) for the duration of the COVID-19 declaration under Section 564(b)(1) of the Act, 21 U.S.C.section 360bbb-3(b)(1), unless the authorization is terminated  or revoked sooner.       Influenza A by PCR NEGATIVE NEGATIVE Final   Influenza B by PCR NEGATIVE NEGATIVE Final    Comment:  (NOTE) The Xpert Xpress SARS-CoV-2/FLU/RSV plus assay is intended as an aid in the diagnosis of influenza from Nasopharyngeal swab specimens and should not be used as a sole basis for treatment. Nasal washings and aspirates are unacceptable for Xpert Xpress SARS-CoV-2/FLU/RSV testing.  Fact Sheet for Patients: BloggerCourse.com  Fact Sheet for Healthcare Providers: SeriousBroker.it  This test is not yet approved or cleared by the [place] FDA and has been authorized for detection and/or diagnosis of SARS-CoV-2 by FDA under an Emergency Use Authorization (EUA). This EUA will remain in effect (meaning this test can be used) for the duration of the COVID-19 declaration under Section 564(b)(1) of the Act, 21 U.S.C. section 360bbb-3(b)(1), unless the authorization is terminated or revoked.  Performed at [facility], [address] [place], [place] [address]   MRSA PCR Screening     Status: None   Collection Time: 12/16/19 10:13 PM   Specimen: Nasopharyngeal  Result Value Ref Range Status   MRSA by PCR NEGATIVE NEGATIVE Final    Comment:        The GeneXpert MRSA Assay (FDA approved for NASAL specimens only), is one component of a comprehensive MRSA colonization surveillance program. It is not intended to diagnose MRSA infection nor to guide or monitor treatment for MRSA infections. Performed at [facility], [address] [place], [place] [address]     Lipid Panel Recent Labs    12/17/19 0435  CHOL 122  TRIG 38  HDL 41  CHOLHDL 3.0  VLDL 8  LDLCALC 73    Studies/Results: MR BRAIN W WO CONTRAST  Result Date: 12/16/2019 CLINICAL DATA:  Neuro deficit, acute, stroke suspected. Additional provided: Paresthesias and numbness  in left arm and leg as well as left peripheral field vision changes. EXAM: MRI HEAD WITHOUT AND WITH CONTRAST TECHNIQUE: Multiplanar, multiecho pulse sequences of the  brain and surrounding structures were obtained without and with intravenous contrast. CONTRAST:  7.60mL GADAVIST GADOBUTROL 1 MMOL/ML IV SOLN COMPARISON:  Noncontrast head CT and CT angiogram head/neck performed earlier today 12/16/2019. FINDINGS: Brain: Cerebral volume is normal. No focal parenchymal signal abnormality is identified. There is no acute infarct. No evidence of intracranial mass. No chronic intracranial blood products. No extra-axial fluid collection. No midline shift. No abnormal intracranial enhancement. Vascular: Expected proximal arterial flow voids. Skull and upper cervical spine: No focal marrow lesion. Sinuses/Orbits: Visualized orbits show no acute finding. No significant paranasal sinus disease at the imaged levels. IMPRESSION: Unremarkable MRI appearance of the brain. No evidence of acute intracranial abnormality, including acute infarction. Electronically Signed   By: Jackey Loge DO   On: 12/16/2019 16:21   CT HEAD CODE STROKE WO CONTRAST  Result Date: 12/16/2019 CLINICAL DATA:  Code stroke. Acute neuro deficit. Left leg numbness. EXAM: CT HEAD WITHOUT CONTRAST TECHNIQUE: Contiguous axial images were obtained from the base of the skull through the vertex without intravenous contrast. COMPARISON:  None. FINDINGS: Brain: No evidence of acute infarction, hemorrhage, hydrocephalus, extra-axial collection or mass lesion/mass effect. Bilateral basal ganglia calcification. Vascular: Negative for hyperdense vessel Skull: Negative Sinuses/Orbits: Paranasal sinuses clear.  Negative orbit Other: None ASPECTS (Alberta Stroke Program Early CT Score) - Ganglionic level infarction (caudate, lentiform nuclei, internal capsule, insula, M1-M3 cortex): 7 - Supraganglionic infarction (M4-M6 cortex): 3 Total score (0-10 with 10 being normal): 10 IMPRESSION: 1. Normal CT head 2. ASPECTS is 10 3. These results were called by telephone at the time of interpretation on 12/16/2019 at 11:33 am to provider [facility] , who verbally acknowledged these results. Electronically Signed   By: Marlan Palau M.D.   On: 12/16/2019 11:33   CT ANGIO HEAD CODE STROKE  Result Date: 12/16/2019 CLINICAL DATA:  Acute neuro deficit.  Rule out stroke. EXAM: CT ANGIOGRAPHY HEAD AND NECK TECHNIQUE: Multidetector CT imaging of the head and neck was performed using the standard protocol during bolus administration of intravenous contrast. Multiplanar CT image reconstructions and MIPs were obtained to evaluate the vascular anatomy. Carotid stenosis measurements (when applicable) are obtained utilizing NASCET criteria, using the distal internal carotid diameter as the denominator. CONTRAST:  25mL OMNIPAQUE IOHEXOL 350 MG/ML SOLN COMPARISON:  CT head 12/16/2019 FINDINGS: CTA NECK FINDINGS Aortic arch: Standard branching. Imaged portion shows no evidence of aneurysm or dissection. No significant stenosis of the major arch vessel origins. Normal aortic arch Right carotid system: Normal right carotid. Negative for atherosclerotic disease or dissection. Left carotid system: Normal left carotid. Negative for atherosclerotic disease or dissection Vertebral arteries: Normal vertebral arteries bilaterally. Negative for atherosclerotic disease or dissection. Skeleton: Negative Other neck: Negative Upper chest: Lung apices clear bilaterally. Review of the MIP images confirms the above findings CTA HEAD FINDINGS Anterior circulation: Cavernous carotid normal bilaterally without stenosis or atherosclerotic disease. Anterior and middle cerebral arteries normal bilaterally without stenosis or large vessel occlusion. Posterior circulation: Both vertebral arteries patent to the basilar. No stenosis or large vessel occlusion in the posterior circulation. Venous sinuses: Normal venous enhancement. Anatomic variants: None Review of the MIP images confirms the above findings IMPRESSION: Normal CT angiogram head and neck. Electronically Signed   By: Marlan Palau M.D.   On: 12/16/2019 13:17   CT ANGIO NECK CODE STROKE  Result Date: 12/16/2019 CLINICAL DATA:  Acute neuro deficit.  Rule out stroke. EXAM: CT ANGIOGRAPHY HEAD AND NECK TECHNIQUE: Multidetector CT imaging of the head and neck was performed using the standard protocol during bolus administration of intravenous contrast. Multiplanar CT image reconstructions and MIPs were obtained to evaluate the vascular anatomy. Carotid stenosis measurements (when applicable) are obtained utilizing NASCET criteria, using the distal internal carotid diameter as the denominator. CONTRAST:  75mL OMNIPAQUE IOHEXOL 350 MG/ML SOLN COMPARISON:  CT head 12/16/2019 FINDINGS: CTA NECK FINDINGS Aortic arch: Standard branching. Imaged portion shows no evidence of aneurysm or dissection. No significant stenosis of the major arch vessel origins. Normal aortic arch Right carotid system: Normal right carotid. Negative for atherosclerotic disease or dissection. Left carotid system: Normal left carotid. Negative for atherosclerotic disease or dissection Vertebral arteries: Normal vertebral arteries bilaterally. Negative for atherosclerotic disease or dissection. Skeleton: Negative Other neck: Negative Upper chest: Lung apices clear bilaterally. Review of the MIP images confirms the above findings CTA HEAD FINDINGS Anterior circulation: Cavernous carotid normal bilaterally without stenosis or atherosclerotic disease. Anterior and middle cerebral arteries normal bilaterally without stenosis or large vessel occlusion. Posterior circulation: Both vertebral arteries patent to the basilar. No stenosis or large vessel occlusion in the posterior circulation. Venous sinuses: Normal venous enhancement. Anatomic variants: None Review of the MIP images confirms the above findings IMPRESSION: Normal CT angiogram head and neck. Electronically Signed   By: Marlan Palau M.D.   On: 12/16/2019 13:17    Medications:  Scheduled: .  stroke: mapping our  early stages of recovery book   Does not apply Once  . Chlorhexidine Gluconate Cloth  6 each Topical Daily  . pantoprazole (PROTONIX) IV  40 mg Intravenous QHS   Continuous: . sodium chloride 75 mL/hr at 12/16/19 1805  . clevidipine    . famotidine (PEPCID) IV Stopped (12/16/19 1748)    Assessment: 24 y.o. female presenting on Saturday 12/4 with acute onset of left sided sensory numbness. Exam also revealed LUE and LLE weakness. Code Stroke was called in the ED. 1. High suspicion for acute ischemic stroke based on symptoms and exam during STAT consult for Code Stroke yesterday. NIHSS of 1 for sensory loss, but also with left sided weakness, which was not scored on NIHSS due to no drift. Other DDx includes new onset MS and migraine accompaniment (complicated migraine symptoms without headache).  2. CTA of head and neck: Normal.  3. MRI brain from yesterday, following tPA: Unremarkable MRI appearance of the brain. No evidence of acute intracranial abnormality, including acute infarction.  4. Stroke Risk Factors - None 5. Lipid panel normal.  6. Will need MRI of cervical spine to assess for possible cervical spinal cord lesion (ordered).   Recommendations: 1. PT/OT/Speech  2. TTE 3. HgbA1c 4. Telemetry monitoring 5. Frequent neuro checks 6. ICU team is admitting for 24 hour close monitoring. If repeat CT head at 24 hours is negative for hemorrhagic conversion and she is clinically stable, can transfer to the medical floor at that time.  7. BP management per stroke order set  8. No antiplatelet medications or anticoagulants for at least 24 hours following tPA.  9. DVT prophylaxis with SCDs.  10. Hypercoagulable panel.   Addendum:  TTE completed: 1. Left ventricular ejection fraction, by estimation, is 60 to 65%. The  left ventricle has normal function. The left ventricle has no regional  wall motion abnormalities. Left ventricular diastolic parameters were  normal.  2. Right  ventricular systolic  function is normal. The right ventricular  size is normal. There is normal pulmonary artery systolic pressure.  3. The mitral valve is normal in structure. No evidence of mitral valve  regurgitation. No evidence of mitral stenosis.  4. The aortic valve is normal in structure. Aortic valve regurgitation is  not visualized. No aortic stenosis is present.  5. The inferior vena cava is normal in size with greater than 50%  respiratory variability, suggesting right atrial pressure of 3 mmHg.   MRI cervical spine completed: Negative MRI cervical spine with contrast.  Normal cervical cord.  24 hour follow up CT head completed: Unremarkable non-contrast CT appearance of the brain. No evidence of acute intracranial abnormality.  Additional recommendations: - The patient should be started on ASA 81 mg po qd.  - Will need outpatient Neurology follow up at the [facility] clinic - Will need to have pending labs assessed at outpatient follow up with Neurology   LOS: 1 day   @Electronically  signed: Dr. Caryl Pina 12/17/2019  8:26 AM

## 2019-12-17 NOTE — Progress Notes (Signed)
Neuro: stable at baseline, no complaints of numbness or tingling, stable gait, full strength, no dizziness or vision changes Resp: stable on room air CV: afebrile, vital signs stable, warm and well perfused GIGU: tolerating PO well, no emesis, no BM, voiding well-stable gait when up to toilet in room Skin: clean, dry and intact, peripheral IV x 2 removed prior to discharge Social: stable at baseline, husband at the bedside for most of the day, all questions and concerns addressed  Events: Discharged home at 1626, taken downstairs by [employer]. All belongings accounted for and sent home with patient. Discharge instructions reviewed with patient and husband.

## 2019-12-17 NOTE — Progress Notes (Signed)
OT Cancellation Note  Patient Details Name: BLIA TOTMAN MRN: [id number] DOB: [birth year]   Cancelled Treatment:    Reason Eval/Treat Not Completed: Patient not medically ready;Active bedrest order. OT order received and chart reviewed - pt noted to be on strict bed rest per TPA protocol. TPA administered 12/4 at 1327. Will hold pending follow up imaging and will initiate services as pt medically appropriate to participate in therapy.   Kathie Dike, M.S. OTR/L  12/17/19, 8:40 AM  ascom [phone]

## 2019-12-17 NOTE — Progress Notes (Addendum)
Follow up - Critical Care Medicine Note  Patient Details:    Gabriela Davidson is an 24 y.o. female with complaint of acute left-sided weakness and numbness.  Patient received TPA for suspected ischemic stroke, had recovery after TPA, admitted to ICU post TPA per protocol  Lines, Airways, Drains:    Anti-infectives:  Anti-infectives (From admission, onward)   None     Scheduled Meds: .  stroke: mapping our early stages of recovery book   Does not apply Once  . Chlorhexidine Gluconate Cloth  6 each Topical Daily  . pantoprazole (PROTONIX) IV  40 mg Intravenous QHS   Continuous Infusions: . sodium chloride 75 mL/hr at 12/16/19 1805  . clevidipine    . famotidine (PEPCID) IV 20 mg (12/17/19 1004)   PRN Meds:.acetaminophen, docusate sodium, ondansetron (ZOFRAN) IV, polyethylene glycol, senna-docusate   Microbiology: Results for orders placed or performed during the hospital encounter of 12/16/19  Resp Panel by RT-PCR (Flu A&B, Covid) Nasopharyngeal Swab     Status: None   Collection Time: 12/16/19  1:53 PM   Specimen: Nasopharyngeal Swab; Nasopharyngeal(NP) swabs in vial transport medium  Result Value Ref Range Status   SARS Coronavirus 2 by RT PCR NEGATIVE NEGATIVE Final    Comment: (NOTE) SARS-CoV-2 target nucleic acids are NOT DETECTED.  The SARS-CoV-2 RNA is generally detectable in upper respiratory specimens during the acute phase of infection. The lowest concentration of SARS-CoV-2 viral copies this assay can detect is 138 copies/mL. A negative result does not preclude SARS-Cov-2 infection and should not be used as the sole basis for treatment or other patient management decisions. A negative result may occur with  improper specimen collection/handling, submission of specimen other than nasopharyngeal swab, presence of viral mutation(s) within the areas targeted by this assay, and inadequate number of viral copies(<138 copies/mL). A negative result must be combined with  clinical observations, patient history, and epidemiological information. The expected result is Negative.  Fact Sheet for Patients:  BloggerCourse.com  Fact Sheet for Healthcare Providers:  SeriousBroker.it  This test is no t yet approved or cleared by the [place] FDA and  has been authorized for detection and/or diagnosis of SARS-CoV-2 by FDA under an Emergency Use Authorization (EUA). This EUA will remain  in effect (meaning this test can be used) for the duration of the COVID-19 declaration under Section 564(b)(1) of the Act, 21 U.S.C.section 360bbb-3(b)(1), unless the authorization is terminated  or revoked sooner.       Influenza A by PCR NEGATIVE NEGATIVE Final   Influenza B by PCR NEGATIVE NEGATIVE Final    Comment: (NOTE) The Xpert Xpress SARS-CoV-2/FLU/RSV plus assay is intended as an aid in the diagnosis of influenza from Nasopharyngeal swab specimens and should not be used as a sole basis for treatment. Nasal washings and aspirates are unacceptable for Xpert Xpress SARS-CoV-2/FLU/RSV testing.  Fact Sheet for Patients: BloggerCourse.com  Fact Sheet for Healthcare Providers: SeriousBroker.it  This test is not yet approved or cleared by the [place] FDA and has been authorized for detection and/or diagnosis of SARS-CoV-2 by FDA under an Emergency Use Authorization (EUA). This EUA will remain in effect (meaning this test can be used) for the duration of the COVID-19 declaration under Section 564(b)(1) of the Act, 21 U.S.C. section 360bbb-3(b)(1), unless the authorization is terminated or revoked.  Performed at [facility], [address], [place], [place] [address]   MRSA PCR Screening     Status: None   Collection Time: 12/16/19 10:13  PM   Specimen: Nasopharyngeal  Result Value Ref Range Status   MRSA by PCR NEGATIVE NEGATIVE Final     Comment:        The GeneXpert MRSA Assay (FDA approved for NASAL specimens only), is one component of a comprehensive MRSA colonization surveillance program. It is not intended to diagnose MRSA infection nor to guide or monitor treatment for MRSA infections. Performed at [facility], [address] [place], [place] [address]     Best Practice/Protocols:  VTE Prophylaxis: Mechanical GI Prophylaxis: Proton Pump Inhibitor Stroke Protocol/TPA  Events: 12/16/2019: Admitted to ICU post TPA for suspected stroke due to left-sided weakness 12/17/2019: Uneventful night.  Doing well.  MRI negative.  Session to floor status after 24 hour head CT done  Studies: MR BRAIN W WO CONTRAST  Result Date: 12/16/2019 CLINICAL DATA:  Neuro deficit, acute, stroke suspected. Additional provided: Paresthesias and numbness in left arm and leg as well as left peripheral field vision changes. EXAM: MRI HEAD WITHOUT AND WITH CONTRAST TECHNIQUE: Multiplanar, multiecho pulse sequences of the brain and surrounding structures were obtained without and with intravenous contrast. CONTRAST:  7.11mL GADAVIST GADOBUTROL 1 MMOL/ML IV SOLN COMPARISON:  Noncontrast head CT and CT angiogram head/neck performed earlier today 12/16/2019. FINDINGS: Brain: Cerebral volume is normal. No focal parenchymal signal abnormality is identified. There is no acute infarct. No evidence of intracranial mass. No chronic intracranial blood products. No extra-axial fluid collection. No midline shift. No abnormal intracranial enhancement. Vascular: Expected proximal arterial flow voids. Skull and upper cervical spine: No focal marrow lesion. Sinuses/Orbits: Visualized orbits show no acute finding. No significant paranasal sinus disease at the imaged levels. IMPRESSION: Unremarkable MRI appearance of the brain. No evidence of acute intracranial abnormality, including acute infarction. Electronically Signed   By: Jackey Loge DO   On: 12/16/2019  16:21   CT HEAD CODE STROKE WO CONTRAST  Result Date: 12/16/2019 CLINICAL DATA:  Code stroke. Acute neuro deficit. Left leg numbness. EXAM: CT HEAD WITHOUT CONTRAST TECHNIQUE: Contiguous axial images were obtained from the base of the skull through the vertex without intravenous contrast. COMPARISON:  None. FINDINGS: Brain: No evidence of acute infarction, hemorrhage, hydrocephalus, extra-axial collection or mass lesion/mass effect. Bilateral basal ganglia calcification. Vascular: Negative for hyperdense vessel Skull: Negative Sinuses/Orbits: Paranasal sinuses clear.  Negative orbit Other: None ASPECTS (Alberta Stroke Program Early CT Score) - Ganglionic level infarction (caudate, lentiform nuclei, internal capsule, insula, M1-M3 cortex): 7 - Supraganglionic infarction (M4-M6 cortex): 3 Total score (0-10 with 10 being normal): 10 IMPRESSION: 1. Normal CT head 2. ASPECTS is 10 3. These results were called by telephone at the time of interpretation on 12/16/2019 at 11:33 am to provider [facility] , who verbally acknowledged these results. Electronically Signed   By: Marlan Palau M.D.   On: 12/16/2019 11:33   CT ANGIO HEAD CODE STROKE  Result Date: 12/16/2019 CLINICAL DATA:  Acute neuro deficit.  Rule out stroke. EXAM: CT ANGIOGRAPHY HEAD AND NECK TECHNIQUE: Multidetector CT imaging of the head and neck was performed using the standard protocol during bolus administration of intravenous contrast. Multiplanar CT image reconstructions and MIPs were obtained to evaluate the vascular anatomy. Carotid stenosis measurements (when applicable) are obtained utilizing NASCET criteria, using the distal internal carotid diameter as the denominator. CONTRAST:  60mL OMNIPAQUE IOHEXOL 350 MG/ML SOLN COMPARISON:  CT head 12/16/2019 FINDINGS: CTA NECK FINDINGS Aortic arch: Standard branching. Imaged portion shows no evidence of aneurysm or dissection. No significant stenosis of the  major arch vessel origins. Normal  aortic arch Right carotid system: Normal right carotid. Negative for atherosclerotic disease or dissection. Left carotid system: Normal left carotid. Negative for atherosclerotic disease or dissection Vertebral arteries: Normal vertebral arteries bilaterally. Negative for atherosclerotic disease or dissection. Skeleton: Negative Other neck: Negative Upper chest: Lung apices clear bilaterally. Review of the MIP images confirms the above findings CTA HEAD FINDINGS Anterior circulation: Cavernous carotid normal bilaterally without stenosis or atherosclerotic disease. Anterior and middle cerebral arteries normal bilaterally without stenosis or large vessel occlusion. Posterior circulation: Both vertebral arteries patent to the basilar. No stenosis or large vessel occlusion in the posterior circulation. Venous sinuses: Normal venous enhancement. Anatomic variants: None Review of the MIP images confirms the above findings IMPRESSION: Normal CT angiogram head and neck. Electronically Signed   By: Marlan Palauharles  Grimmer M.D.   On: 12/16/2019 13:17   CT ANGIO NECK CODE STROKE  Result Date: 12/16/2019 CLINICAL DATA:  Acute neuro deficit.  Rule out stroke. EXAM: CT ANGIOGRAPHY HEAD AND NECK TECHNIQUE: Multidetector CT imaging of the head and neck was performed using the standard protocol during bolus administration of intravenous contrast. Multiplanar CT image reconstructions and MIPs were obtained to evaluate the vascular anatomy. Carotid stenosis measurements (when applicable) are obtained utilizing NASCET criteria, using the distal internal carotid diameter as the denominator. CONTRAST:  75mL OMNIPAQUE IOHEXOL 350 MG/ML SOLN COMPARISON:  CT head 12/16/2019 FINDINGS: CTA NECK FINDINGS Aortic arch: Standard branching. Imaged portion shows no evidence of aneurysm or dissection. No significant stenosis of the major arch vessel origins. Normal aortic arch Right carotid system: Normal right carotid. Negative for atherosclerotic  disease or dissection. Left carotid system: Normal left carotid. Negative for atherosclerotic disease or dissection Vertebral arteries: Normal vertebral arteries bilaterally. Negative for atherosclerotic disease or dissection. Skeleton: Negative Other neck: Negative Upper chest: Lung apices clear bilaterally. Review of the MIP images confirms the above findings CTA HEAD FINDINGS Anterior circulation: Cavernous carotid normal bilaterally without stenosis or atherosclerotic disease. Anterior and middle cerebral arteries normal bilaterally without stenosis or large vessel occlusion. Posterior circulation: Both vertebral arteries patent to the basilar. No stenosis or large vessel occlusion in the posterior circulation. Venous sinuses: Normal venous enhancement. Anatomic variants: None Review of the MIP images confirms the above findings IMPRESSION: Normal CT angiogram head and neck. Electronically Signed   By: Marlan Palauharles  Irigoyen M.D.   On: 12/16/2019 13:17    Consults:    Subjective:    Overnight Issues: No untoward events overnight.  No neurologic decompensation.  Objective:  Vital signs for last 24 hours: Temp:  [97.8 F (36.6 C)-98.8 F (37.1 C)] 98.7 F (37.1 C) (12/05 0800) Pulse Rate:  [62-119] 82 (12/05 0900) Resp:  [12-23] 20 (12/05 0900) BP: (92-164)/(54-113) 108/71 (12/05 0900) SpO2:  [98 %-100 %] 99 % (12/05 0900) Weight:  [66.7 kg-71.5 kg] 71.5 kg (12/04 1133)  Hemodynamic parameters for last 24 hours:    Intake/Output from previous day: No intake/output data recorded.  Intake/Output this shift: Total I/O In: 237 [P.O.:237] Out: -   Vent settings for last 24 hours:    Physical Exam:  Physical Exam  GENERAL: Well-developed well-nourished woman, laying on stretcher, no acute distress, awake alert oriented x4. HEAD: Normocephalic, atraumatic.  No facial droop or asymmetry  EYES: Pupils equal, round, reactive to light.  No scleral icterus. MOUTH: Pharynx clear, oral mucosa  moist. NECK: Supple. No thyromegaly. Trachea midline. No JVD.  No adenopathy. PULMONARY: Good air entry bilaterally.  No adventitious sounds.  CARDIOVASCULAR: S1 and S2. Regular rate and rhythm.  ABDOMEN: Nondistended, normoactive bowel sounds, no tenderness, no hepatosplenomegaly. MUSCULOSKELETAL: No joint deformity, no clubbing, no edema.  NEUROLOGIC: Post TPA exam is intact with symmetrical strength on upper and lower extremities. SKIN: Intact,warm,dry. PSYCH: Mood and behavior normal.  Assessment/Plan:   High suspicion for acute ischemic stroke DDx: Atypical migraine Patient received TPA Neurologic improvement post TPA Continued observation, she is on post TPA protocol  MRI post TPA unremarkable Thrombophilia work-up pending Connective tissue disease work-up pending Lipid panel and thyroid panel normal 2D echo done, interpretation pending Passed swallow eval, advance diet as per stroke protocol Neurology following, appreciate input  Tachycardia -resolved Likely physiologic response to stress/anxiety No hemodynamic instability No recurrence Monitor    LOS: 1 day   Additional comments: Care coordination was performed with bedside nurse.  Discussed with Dr. Otelia Limes, neurology.  Transferred to general medical floor once 24-hour post TPA CT cleared.  Critical Care Total Time* Level 2 follow-up  C. Danice Goltz, MD [place] 12/17/2019  *This note was dictated using voice recognition software/Dragon.  Despite best efforts to proofread, errors can occur which can change the meaning.  Any change was purely unintentional.

## 2019-12-17 NOTE — Progress Notes (Signed)
Spoke with Dr. Otelia Limes, Neuro at the bedside.  He said ok to get up to the bathroom as long as RN is at the patient's side.

## 2019-12-17 NOTE — Discharge Instructions (Signed)
Eating Plan After Stroke A stroke causes damage to the brain cells, which can affect your ability to walk, talk, and even eat. The impact of a stroke is different for everyone, and so is recovery. A good nutrition plan is important for your recovery. It can also lower your risk of another stroke. If you have difficulty chewing and swallowing your food, a dietitian or your stroke care team can help so that you can enjoy eating healthy foods. What are tips for following this plan?  Reading food labels  Choose foods that have less than 300 milligrams (mg) of sodium per serving. Limit your sodium intake to less than 1,500 mg per day.  Avoid foods that have saturated fat and trans fat.  Choose foods that are low in cholesterol. Limit the amount of cholesterol you eat each day to less than 200 mg.  Choose foods that are high in fiber. Eat 20-30 grams (g) of fiber each day.  Avoid foods with added sugar. Check the food label for ingredients such as sugar, corn syrup, honey, fructose, molasses, and cane juice. Shopping  At the grocery store, buy most of your food from areas near the walls of the store. This includes: ? Fresh fruits and vegetables. ? Dry grains, beans, nuts, and seeds. ? Fresh seafood, poultry, lean meats, and eggs. ? Low-fat dairy products.  Buy whole ingredients instead of prepackaged foods.  Buy fresh, in-season fruits and vegetables from local farmers markets.  Buy frozen fruits and vegetables in resealable bags. Cooking  Prepare foods with very little salt. Use herbs or salt-free spices instead.  Cook with heart-healthy oils, such as olive, avocado, canola, soybean, or sunflower oil.  Avoid frying foods. Bake, grill, or broil foods instead.  Remove visible fat and skin from meat and poultry before eating.  Modify food textures as told by your health care provider. Meal planning  Eat a wide variety of colorful fruits and vegetables. Make sure one-half of your  plate is filled with fruits and vegetables at each meal.  Eat fruits and vegetables that are high in potassium, such as: ? Apples, bananas, oranges, and melon. ? Sweet potatoes, spinach, zucchini, and tomatoes.  Eat fish that contain heart-healthy fats (omega-3 fats) at least twice a week. These include salmon, tuna, mackerel, and sardines.  Eat plant foods that are high in omega-3 fats, such as flaxseeds and walnuts. Add these to cereals, yogurt, or pasta dishes.  Eat several servings of high-fiber foods each day, such as fruits, vegetables, whole grains, and beans.  Do not put salt at the table for meals.  When eating out at restaurants: ? Ask the server about low-salt or salt-free food options. ? Avoid fried foods. Look for menu items that are grilled, steamed, broiled, or roasted. ? Ask if your food can be prepared without butter. ? Ask for condiments, such as salad dressings, gravy, or sauces to be served on the side.  If you have difficulty swallowing: ? Choose foods that are softer and easier to chew and swallow. ? Cut foods into small pieces and chew well before swallowing. ? Thicken liquids as told by your health care provider or dietitian. ? Let your health care provider know if your condition does not improve over time. You may need to work with a speech therapist to re-train the muscles that are used for eating. General recommendations  Involve your family and friends in your recovery, if possible. It may be helpful to have a slower meal  time and to plan meals that include foods everyone in the family can eat.  Brush your teeth with fluoride toothpaste twice a day, and floss once a day. Keeping a clean mouth can help you swallow and can also help your appetite.  Drink enough water each day to keep your urine pale yellow. If needed, set reminders or ask your family to help you remember to drink water.  Limit alcohol intake to no more than 1 drink a day for nonpregnant  women and 2 drinks a day for men. One drink equals 12 oz of beer, 5 oz of wine, or 1 oz of hard liquor. Summary  Following this eating plan can help in your stroke recovery and can decrease your risk for another stroke.  Let your health care provider know if you have problems with swallowing. You may need to work with a speech therapist. This information is not intended to replace advice given to you by your health care provider. Make sure you discuss any questions you have with your health care provider. Document Revised: 04/21/2018 Document Reviewed: 03/08/2017 Elsevier Patient Education  2020 [facility] Discharge After a Stroke  Being discharged from the hospital after a stroke can feel overwhelming. Many things may be different, and it is normal to feel scared or anxious. Some stroke survivors may be able to return to their homes, and others may need more specialized care on a temporary or permanent basis. Your stroke care team will work with you to develop a discharge plan that is best for you. Ask questions if you do not understand something. Invite a friend or family member to participate in discharge planning. Understanding and following your discharge plan can help to prevent another stroke or other problems. Understanding your medicines After a stroke, your health care provider may prescribe one or more types of medicine. It is important to take medicines exactly as told by your health care provider. Serious harm, such as another stroke, can happen if you are unable to take your medicine exactly as prescribed. Make sure you understand:  What medicine to take.  Why you are taking the medicine.  How and when to take it.  If it can be taken with your other medicines and herbal supplements.  Possible side effects.  When to call your health care provider if you have any side effects.  How you will get and pay for your medicines. Medical assistance programs may be  able to help you pay for prescription medicines if you cannot afford them. If you are taking an anticoagulant, be sure to take it exactly as told by your health care provider. This type of medicine can increase the risk of bleeding because it works to prevent blood from clotting. You may need to take certain precautions to prevent bleeding. You should contact your health care provider if you have:  Bleeding or bruising.  A fall or other injury to your head.  Blood in your urine or stool (feces). Planning for home safety  Take steps to prevent falls, such as installing grab bars or using a shower chair. Ask a friend or family member to get needed things in place before you go home if possible. A therapist can come to your home to make recommendations for safety equipment. Ask your health care provider if you would benefit from this service or from home care. Getting needed equipment Ask your health care provider for a list of any medical equipment and supplies you  will need at home. These may include items such as:  Walkers.  Canes.  Wheelchairs.  Hand-strengthening devices.  Special eating utensils. Medical equipment can be rented or purchased, depending on your insurance coverage. Check with your insurance company about what is covered. Keeping follow-up visits After a stroke, you will need to follow up regularly with a health care provider. You may also need rehabilitation, which can include physical therapy, occupational therapy, or speech-language therapy. Keeping these appointments is very important to your recovery after a stroke. Be sure to bring your medicine list and discharge papers with you to your appointments. If you need help to keep track of your schedule, use a calendar or appointment reminder. Preventing another stroke Having a stroke puts you at risk for another stroke in the future. Ask your health care provider what actions you can take to lower the risk. These may  include:  Increasing how much you exercise.  Making a healthy eating plan.  Quitting smoking.  Managing other health conditions, such as high blood pressure, high cholesterol, or diabetes.  Limiting alcohol use. Knowing the warning signs of a stroke  Make sure you understand the signs of a stroke. Before you leave the hospital, you will receive information outlining the stroke warning signs. Share these with your friends and family members. "BE FAST" is an easy way to remember the main warning signs of a stroke:  B - Balance. Signs are dizziness, sudden trouble walking, or loss of balance.  E - Eyes. Signs are trouble seeing or a sudden change in vision.  F - Face. Signs are sudden weakness or numbness of the face, or the face or eyelid drooping on one side.  A - Arms. Signs are weakness or numbness in an arm. This happens suddenly and usually on one side of the body.  S - Speech. Signs are sudden trouble speaking, slurred speech, or trouble understanding what people say.  T - Time. Time to call emergency services. Write down what time symptoms started. Other signs of stroke may include:  A sudden, severe headache with no known cause.  Nausea or vomiting.  Seizure. These symptoms may represent a serious problem that is an emergency. Do not wait to see if the symptoms will go away. Get medical help right away. Call your local emergency services (911 in the [place] Do not drive yourself to the hospital. Make note of the time that you had your first symptoms. Your emergency responders or emergency room staff will need to know this information. Summary  Being discharged from the hospital after a stroke can feel overwhelming. It is normal to feel scared or anxious.  Make sure you take medicines exactly as told by your health care provider.  Know the warning signs of a stroke, and get help right way if you have any of these symptoms. "BE FAST" is an easy way to remember the main  warning signs of a stroke. This information is not intended to replace advice given to you by your health care provider. Make sure you discuss any questions you have with your health care provider. Document Revised: 09/21/2018 Document Reviewed: 04/03/2016 Elsevier Patient Education  [place].   Fatigue If you have fatigue, you feel tired all the time and have a lack of energy or a lack of motivation. Fatigue may make it difficult to start or complete tasks because of exhaustion. In general, occasional or mild fatigue is often a normal response to activity or life. However,  long-lasting (chronic) or extreme fatigue may be a symptom of a medical condition. Follow these instructions at home: General instructions  Watch your fatigue for any changes.  Go to bed and get up at the same time every day.  Avoid fatigue by pacing yourself during the day and getting enough sleep at night.  Maintain a healthy weight. Medicines  Take over-the-counter and prescription medicines only as told by your health care provider.  Take a multivitamin, if told by your health care provider.  Do not use herbal or dietary supplements unless they are approved by your health care provider. Activity   Exercise regularly, as told by your health care provider.  Use or practice techniques to help you relax, such as yoga, tai chi, meditation, or massage therapy. Eating and drinking   Avoid heavy meals in the evening.  Eat a well-balanced diet, which includes lean proteins, whole grains, plenty of fruits and vegetables, and low-fat dairy products.  Avoid consuming too much caffeine.  Avoid the use of alcohol.  Drink enough fluid to keep your urine pale yellow. Lifestyle  Change situations that cause you stress. Try to keep your work and personal schedule in balance.  Do not use any products that contain nicotine or tobacco, such as cigarettes and e-cigarettes. If you need help quitting, ask  your health care provider.  Do not use drugs. Contact a health care provider if:  Your fatigue does not get better.  You have a fever.  You suddenly lose or gain weight.  You have headaches.  You have trouble falling asleep or sleeping through the night.  You feel angry, guilty, anxious, or sad.  You are unable to have a bowel movement (constipation).  Your skin is dry.  You have swelling in your legs or another part of your body. Get help right away if:  You feel confused.  Your vision is blurry.  You feel faint or you pass out.  You have a severe headache.  You have severe pain in your abdomen, your back, or the area between your waist and hips (pelvis).  You have chest pain, shortness of breath, or an irregular or fast heartbeat.  You are unable to urinate, or you urinate less than normal.  You have abnormal bleeding, such as bleeding from the rectum, vagina, nose, lungs, or nipples.  You vomit blood.  You have thoughts about hurting yourself or others. If you ever feel like you may hurt yourself or others, or have thoughts about taking your own life, get help right away. You can go to your nearest emergency department or call:  Your local emergency services (911 in the [place]  A suicide crisis helpline, such as the [facility] at 484-095-2548. This is open 24 hours a day. Summary  If you have fatigue, you feel tired all the time and have a lack of energy or a lack of motivation.  Fatigue may make it difficult to start or complete tasks because of exhaustion.  Long-lasting (chronic) or extreme fatigue may be a symptom of a medical condition.  Exercise regularly, as told by your health care provider.  Change situations that cause you stress. Try to keep your work and personal schedule in balance. This information is not intended to replace advice given to you by your health care provider. Make sure you discuss any questions you  have with your health care provider. Document Revised: 07/20/2018 Document Reviewed: 09/23/2016 Elsevier Patient Education  2020 [place] not  return to work until [facility] Dis[facility] After a St[facility]  Being discharged from the hospital after a stroke can feel overwhelming. Many things may be different, and it is normal to feel scared or anxious. Some stroke survivors may be able to return to their homes, and others may need more specialized care on a temporary or permanent basis. Your stroke care team will work with you to develop a discharge plan that is best for you. Ask questions if you do not understand something. Invite a friend or family member to participate in discharge planning. Understanding and following your discharge plan can help to prevent another stroke or other problems. Understanding your medicines After a stroke, your health care provider may prescribe one or more types of medicine. It is important to take medicines exactly as told by your health care provider. Serious harm, such as another stroke, can happen if you are unable to take your medicine exactly as prescribed. Make sure you understand:  What medicine to take.  Why you are taking the medicine.  How and when to take it.  If it can be taken with your other medicines and herbal supplements.  Possible side effects.  When to call your health care provider if you have any side effects.  How you will get and pay for your medicines. Medical assistance programs may be able to help you pay for prescription medicines if you cannot afford them. If you are taking an anticoagulant, be sure to take it exactly as told by your health care provider. This type of medicine can increase the risk of bleeding because it works to prevent blood from clotting. You may need to take certain precautions to prevent bleeding. You should contact your health care provider if you  have:  Bleeding or bruising.  A fall or other injury to your head.  Blood in your urine or stool (feces). Planning for home safety  Take steps to prevent falls, such as installing grab bars or using a shower chair. Ask a friend or family member to get needed things in place before you go home if possible. A therapist can come to your home to make recommendations for safety equipment. Ask your health care provider if you would benefit from this service or from home care. Getting needed equipment Ask your health care provider for a list of any medical equipment and supplies you will need at home. These may include items such as:  Walkers.  Canes.  Wheelchairs.  Hand-strengthening devices.  Special eating utensils. Medical equipment can be rented or purchased, depending on your insurance coverage. Check with your insurance company about what is covered. Keeping follow-up visits After a stroke, you will need to follow up regularly with a health care provider. You may also need rehabilitation, which can include physical therapy, occupational therapy, or speech-language therapy. Keeping these appointments is very important to your recovery after a stroke. Be sure to bring your medicine list and discharge papers with you to your appointments. If you need help to keep track of your schedule, use a calendar or appointment reminder. Preventing another stroke Having a stroke puts you at risk for another stroke in the future. Ask your health care provider what actions you can take to lower the risk. These may include:  Increasing how much you exercise.  Making a healthy eating plan.  Quitting smoking.  Managing other health conditions, such as high blood pressure, high cholesterol, or diabetes.  Limiting alcohol use. Knowing the warning signs of a stroke  Make sure you understand the signs of a stroke. Before you leave the hospital, you will receive information outlining the stroke  warning signs. Share these with your friends and family members. "BE FAST" is an easy way to remember the main warning signs of a stroke:  B - Balance. Signs are dizziness, sudden trouble walking, or loss of balance.  E - Eyes. Signs are trouble seeing or a sudden change in vision.  F - Face. Signs are sudden weakness or numbness of the face, or the face or eyelid drooping on one side.  A - Arms. Signs are weakness or numbness in an arm. This happens suddenly and usually on one side of the body.  S - Speech. Signs are sudden trouble speaking, slurred speech, or trouble understanding what people say.  T - Time. Time to call emergency services. Write down what time symptoms started. Other signs of stroke may include:  A sudden, severe headache with no known cause.  Nausea or vomiting.  Seizure. These symptoms may represent a serious problem that is an emergency. Do not wait to see if the symptoms will go away. Get medical help right away. Call your local emergency services (911 in the [place] Do not drive yourself to the hospital. Make note of the time that you had your first symptoms. Your emergency responders or emergency room staff will need to know this information. Summary  Being discharged from the hospital after a stroke can feel overwhelming. It is normal to feel scared or anxious.  Make sure you take medicines exactly as told by your health care provider.  Know the warning signs of a stroke, and get help right way if you have any of these symptoms. "BE FAST" is an easy way to remember the main warning signs of a stroke. This information is not intended to replace advice given to you by your health care provider. Make sure you discuss any questions you have with your health care provider. Document Revised: 09/21/2018 Document Reviewed: 04/03/2016 Elsevier Patient Education  [place].

## 2019-12-17 NOTE — Discharge Summary (Addendum)
Physician Discharge Summary  Patient ID: Gabriela Davidson MRN: [id number] DOB/AGE: 1995/06/14 24 y.o.  Admit date: 12/16/2019 Discharge date: 12/17/2019  Admission Diagnoses: Acute left-sided weakness Discharge Diagnoses:  Active Problems:   Acute left-sided weakness Acute ischemic stroke  Discharged Condition: good  Hospital Course:  24 year old female presenting 12/4 with acute onset of left-sided sensory numbness and weakness.  Initial examination by neurology revealed left upper lobe extremity and left lower extremity weakness.  Post stroke was activated.  Because of high suspicion for acute ischemic stroke the patient received TPA.  Patient had no stroke risk factors.  Subsequent MRI of the brain and cervical spine post TPA showed no abnormalities.  CT of the head and neck 24 hours post TPA normal.  She has been cleared by speech, PT and OT.  She had complete resolution of symptoms with TPA.  Connective tissue disease and vasculitis work-up was ordered and pending.  Patient has reached maximal hospital benefit and is deemed suitable for discharge to home.  She is in improved and good condition at the time of discharge.  Dr. Caryl Pina, neurologist documents follow-up with [facility] neurology 1 week post discharge.  Patient to start aspirin 81 mg p.o. daily in the morning.  Consults: neurology  Significant Diagnostic Studies: Multiple.  Patient had initial stroke protocol head CT.  This was followed by CTA, MRIs, and 2D echo.  Treatments: TPA  Discharge Exam: Blood pressure 112/72, pulse 76, temperature 98.6 F (37 C), temperature source Oral, resp. rate 18, height 5\' 1"  (1.549 m), weight 71.5 kg, last menstrual period 12/15/2019, SpO2 100 %, not currently breastfeeding.  GENERAL:Well-developed well-nourished woman, laying on stretcher, no acute distress, awake alert oriented x4. HEAD: Normocephalic, atraumatic.No facial droop or asymmetry  EYES: Pupils equal, round,  reactive to light. No scleral icterus. MOUTH:Pharynx clear, oral mucosa moist. NECK: Supple. No thyromegaly. Trachea midline. No JVD. No adenopathy. PULMONARY: Good air entry bilaterally. No adventitious sounds. CARDIOVASCULAR: S1 and S2. Regular rate and rhythm.  ABDOMEN:Nondistended, normoactive bowel sounds, no tenderness, no hepatosplenomegaly. MUSCULOSKELETAL: No joint deformity, no clubbing, no edema.  NEUROLOGIC:Post TPA exam is intact with symmetrical strength on upper and lower extremities. SKIN: Intact,warm,dry. PSYCH:Mood and behavior normal.  Disposition:  Home. She should not go back to work until she is reevaluated by neurology in a week's time.  Allergies as of 12/17/2019   No Known Allergies     Medication List    TAKE these medications   aspirin EC 81 MG tablet Take 1 tablet (81 mg total) by mouth daily. Start taking on: December 18, 2019   desloratadine 5 MG tablet Commonly known as: CLARINEX Take 5 mg by mouth daily.   spironolactone 50 MG tablet Commonly known as: ALDACTONE Take 50 mg by mouth 2 (two) times daily.        Signed: December 20, 2019, MD [facility] 12/17/2019, 3:07 PM  *This note was dictated using voice recognition software/Dragon.  Despite best efforts to proofread, errors can occur which can change the meaning.  Any change was purely unintentional.

## 2019-12-17 NOTE — Significant Event (Signed)
  Critical Care Note -- Cross cover Patient arrived on floor from ED.  Ambulating, feeling well, passed bedside swallow eval and tolerated diet.  Just reports some superficial numbness to both legs after use of some cleansing agents.   Blood pressure 116/76, pulse 77, temperature 98.2 F (36.8 C), resp. rate 14, height 5\' 1"  (1.549 m), weight 71.5 kg, last menstrual period 12/15/2019, SpO2 99 %, not currently breastfeeding.  ___________________ 14/03/2019. Drue Stager, MD [facility]

## 2019-12-17 NOTE — Evaluation (Signed)
Physical Therapy Evaluation Patient Details Name: Gabriela Davidson MRN: [id number] DOB: 02/12/1995 Today's Date: 12/17/2019   History of Present Illness  Patient is a 24 year old female presenting with acute left-sided weakness and numbness as well as vision loss to the temporal aspect of her left eye. s/p tPA administration and stroke work up ongoing. MRI of brain reported no evidence of acute intracranial abnormality. Past medical history of preeclampsia. MRI of cervical spine negative for acute abnormality.   Clinical Impression  PT evaluation completed. Patient is normally independent with mobility and ADLs at baseline. Patient is independent with mobility currently including ambulating around unit without assistive device. High level dynamic balance challenged with head turns, tandem walking, walking backwards, heel walking, and toe walking. Patient was independent with no loss of balance with all activity. Extremity sensation intact and strength is 5/5 with manual muscle testing. No functional deficits noted on exam. Patient likely is at her baseline level of functional mobility with no PT needs at this time.     Follow Up Recommendations No PT follow up    Equipment Recommendations  None recommended by PT    Recommendations for Other Services       Precautions / Restrictions Precautions Precautions: None Restrictions Weight Bearing Restrictions: No      Mobility  Bed Mobility Overal bed mobility: Independent                  Transfers Overall transfer level: Independent                  Ambulation/Gait Ambulation/Gait assistance: Independent Gait Distance (Feet): 300 Feet Assistive device: None Gait Pattern/deviations: WFL(Within Functional Limits) Gait velocity: normal    General Gait Details: no loss of balance with ambulation without assistive device. patient able to navigate obstacles without difficulty.   Stairs            Wheelchair  Mobility    Modified Rankin (Stroke Patients Only)       Balance Overall balance assessment: Needs assistance Sitting-balance support: Feet unsupported Sitting balance-Leahy Scale: Normal Sitting balance - Comments: normal static and dynamic sitting balance    Standing balance support: No upper extremity supported;During functional activity Standing balance-Leahy Scale: Normal Standing balance comment: good dyanmic standing balance without loss of balance with functional activity              High level balance activites: Head turns;Direction changes;Turns;Backward walking (tandem walking, heel walking, toe walking ) High Level Balance Comments: no loss of balance with high level balance activity. 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 good grip strength  RUE Sensation: WNL LUE Deficits / Details: 5/5 shoulder flexion, elbow flexion/extension. good grip strength.  LUE Sensation: WNL  Communication   Communication: No difficulties  Cognition Arousal/Alertness: Awake/alert Behavior During Therapy: WFL for tasks assessed/performed Overall Cognitive Status: Within Functional Limits for tasks assessed                                        General  Comments      Exercises     Assessment/Plan    PT Assessment Patent does not need any further PT services  PT Problem List         PT Treatment Interventions      PT Goals (Current goals can be found in the Care Plan section)       Frequency     Barriers to discharge        Co-evaluation               AM-PAC PT "6 Clicks" Mobility  Outcome Measure Help needed turning from your back to your side while in a flat bed without using bedrails?: None Help needed moving from lying on your back to sitting on the side of a flat bed without using bedrails?: None Help needed moving to and from a bed to a chair (including a wheelchair)?: None Help needed standing up from a chair using your arms (e.g., wheelchair or bedside chair)?: None Help needed to walk in hospital room?: None Help needed climbing 3-5 steps with a railing? : None 6 Click Score: 24    End of Session   Activity Tolerance: Patient tolerated treatment well Patient left: in bed;with call bell/phone within reach Nurse Communication: Mobility status PT Visit Diagnosis: Muscle weakness (generalized) (M62.81)    Time: 5916-3846 PT Time Calculation (min) (ACUTE ONLY): 26 min   Charges:   PT Evaluation $PT Eval Moderate Complexity: 1 Mod PT Treatments $Neuromuscular Re-education: 8-22 mins        Donna Bernard, PT, MPT  Ina Homes 12/17/2019, 2:31 PM

## 2019-12-18 LAB — HOMOCYSTEINE: Homocysteine: 7.2 umol/L (ref 0.0–14.5)

## 2019-12-19 LAB — BETA-2-GLYCOPROTEIN I ABS, IGG/M/A
Beta-2 Glyco I IgG: <9 GPI IgG units (ref 0–20)
Beta-2-Glycoprotein I IgA: <9 GPI IgA units (ref 0–25)
Beta-2-Glycoprotein I IgM: <9 GPI IgM units (ref 0–32)

## 2019-12-19 LAB — PROTEIN C, TOTAL: Protein C, Total: 76 % (ref 60–150)

## 2019-12-19 LAB — CARDIOLIPIN ANTIBODIES, IGG, IGM, IGA
Anticardiolipin IgA: <9 APL U/mL (ref 0–11)
Anticardiolipin IgG: <9 GPL U/mL (ref 0–14)
Anticardiolipin IgM: 10 MPL U/mL (ref 0–12)

## 2019-12-20 DIAGNOSIS — G43109 Migraine with aura, not intractable, without status migrainosus: Secondary | ICD-10-CM | POA: Insufficient documentation

## 2019-12-20 LAB — PROTHROMBIN GENE MUTATION

## 2019-12-20 LAB — PROTEIN S ACTIVITY: Protein S Activity: 79 % (ref 63–140)

## 2019-12-20 LAB — FACTOR 5 LEIDEN

## 2019-12-20 LAB — LUPUS ANTICOAGULANT PANEL
DRVVT: 25.9 s (ref 0.0–47.0)
PTT Lupus Anticoagulant: 39.4 s (ref 0.0–51.9)

## 2019-12-20 LAB — PROTEIN C ACTIVITY: Protein C Activity: 96 % (ref 73–180)

## 2019-12-20 LAB — PROTEIN S, TOTAL: Protein S Ag, Total: 79 % (ref 60–150)

## 2020-07-07 IMAGING — US ULTRASOUND ABDOMEN LIMITED
1 series · 14 of 25 positions shown · non-contrast
Comparison: None.

CLINICAL DATA: 23-year-old female with elevated liver and
pancreatic enzymes.

EXAM:
ULTRASOUND ABDOMEN LIMITED RIGHT UPPER QUADRANT

[Series 1: ultrasound abdomen limited · 14 of 56 slices shown]
[im 1/56]
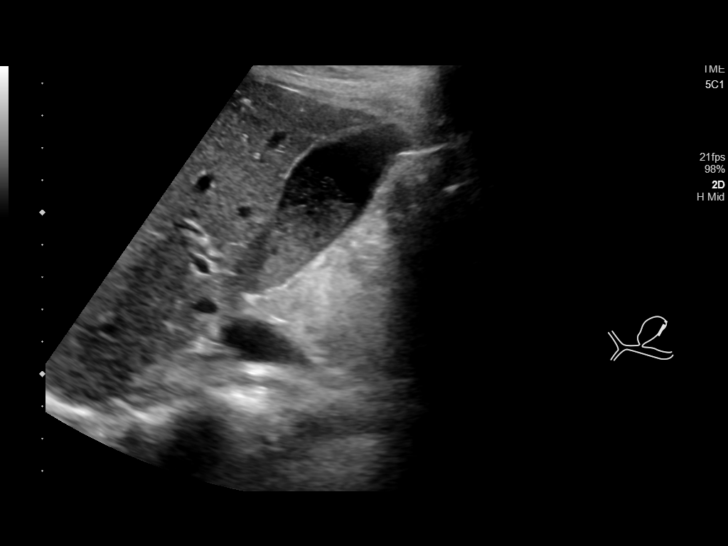
[im 5/56]
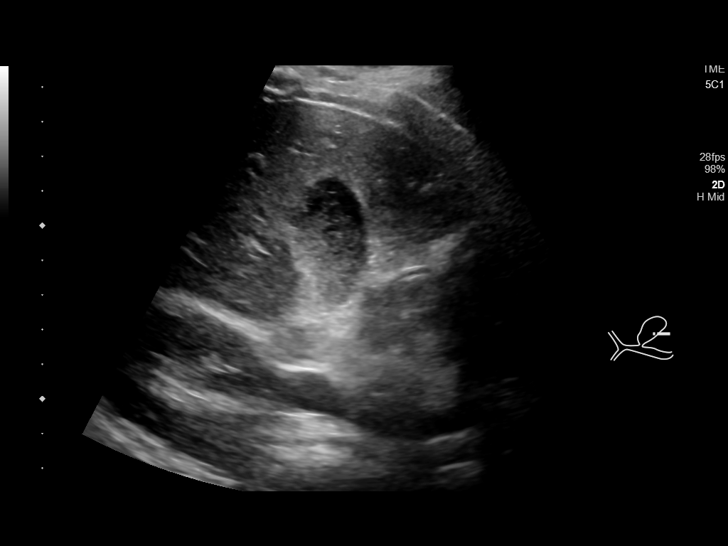
[im 10/56]
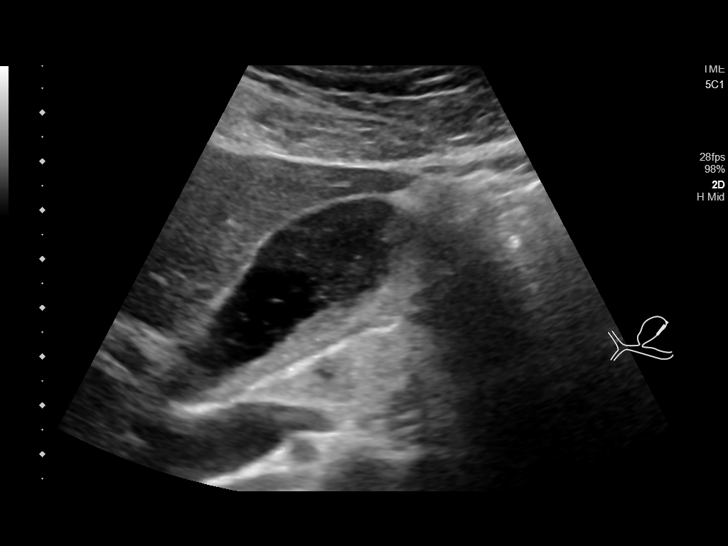
[im 14/56]
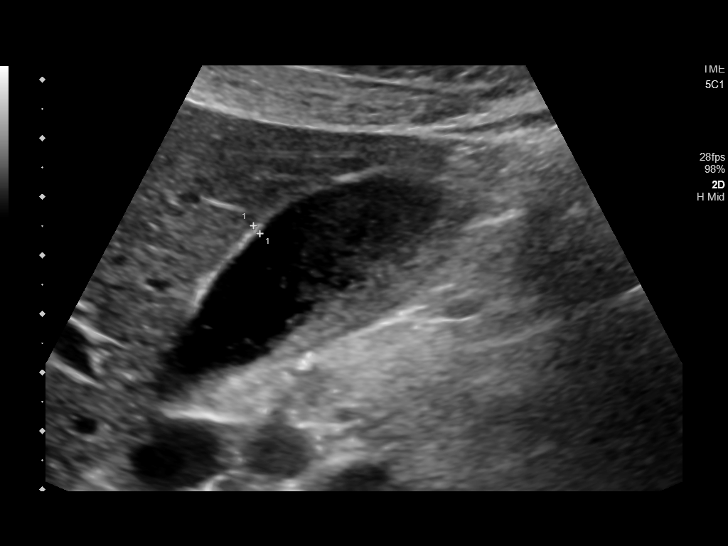
[im 19/56]
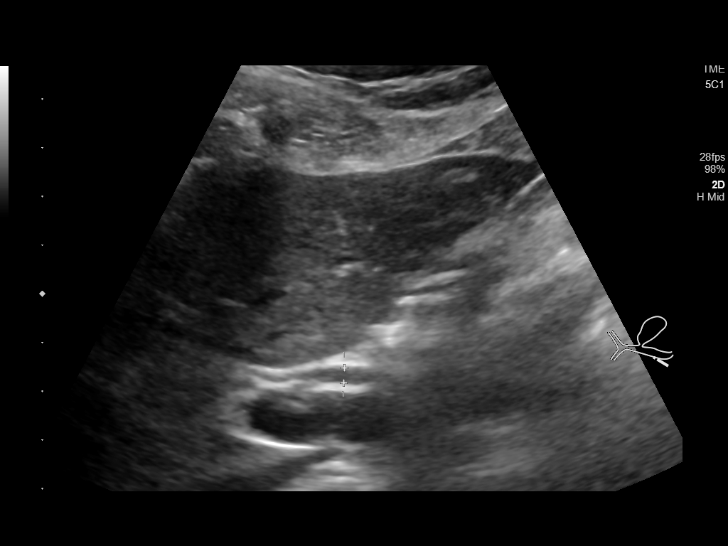
[im 21/56]
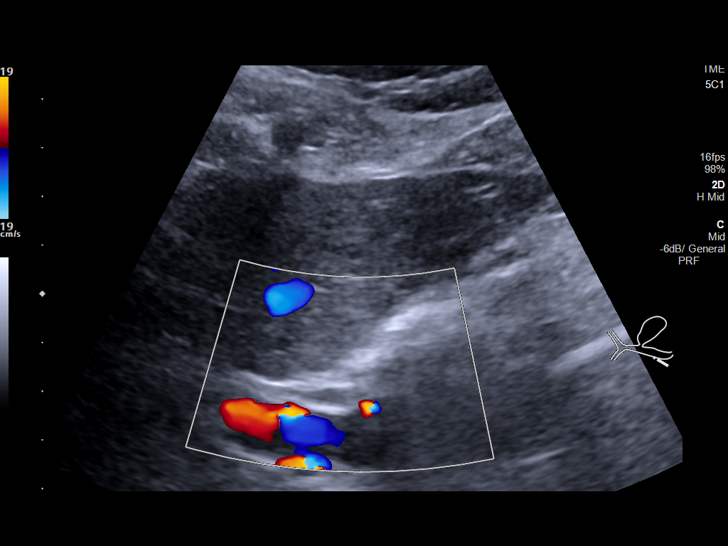
[im 26/56]
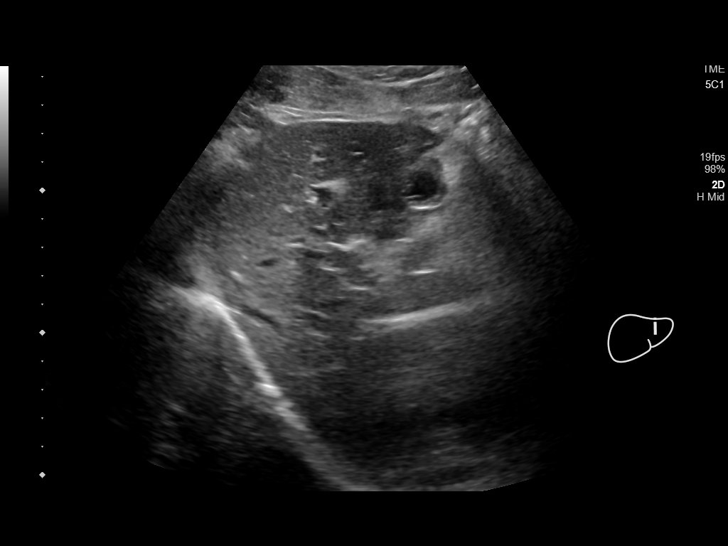
[im 30/56]
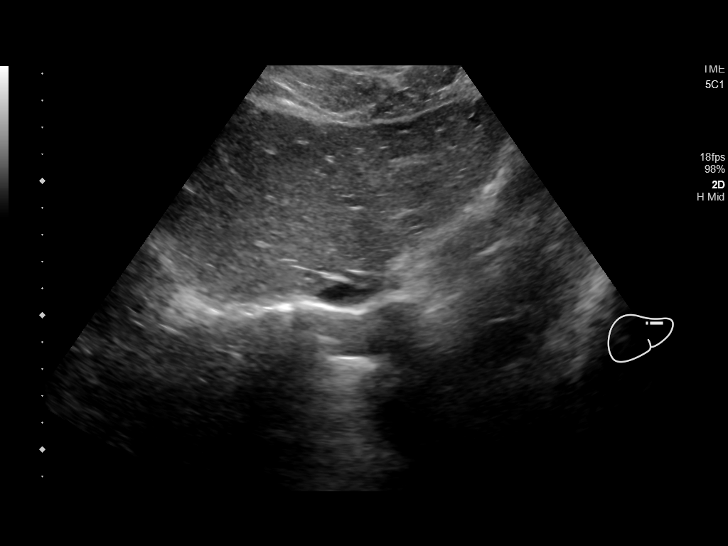
[im 35/56]
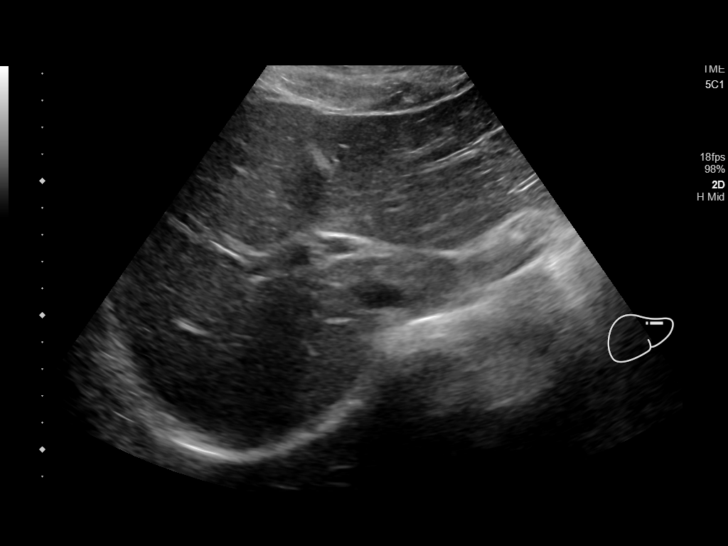
[im 37/56]
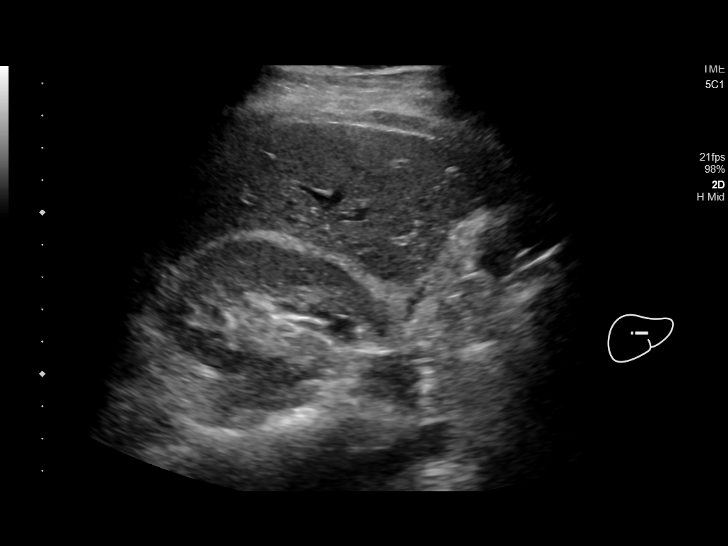
[im 42/56]
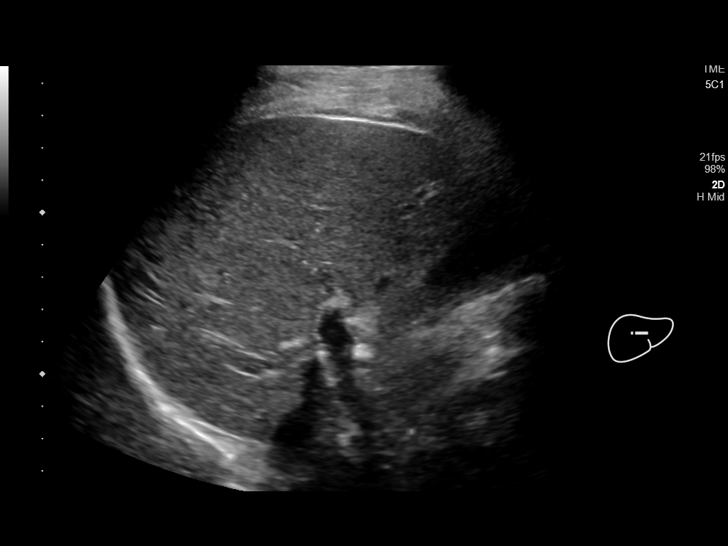
[im 46/56]
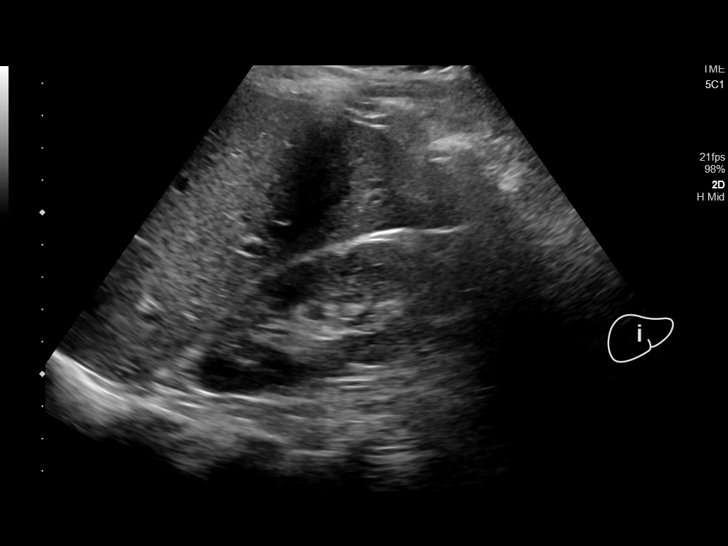
[im 51/56]
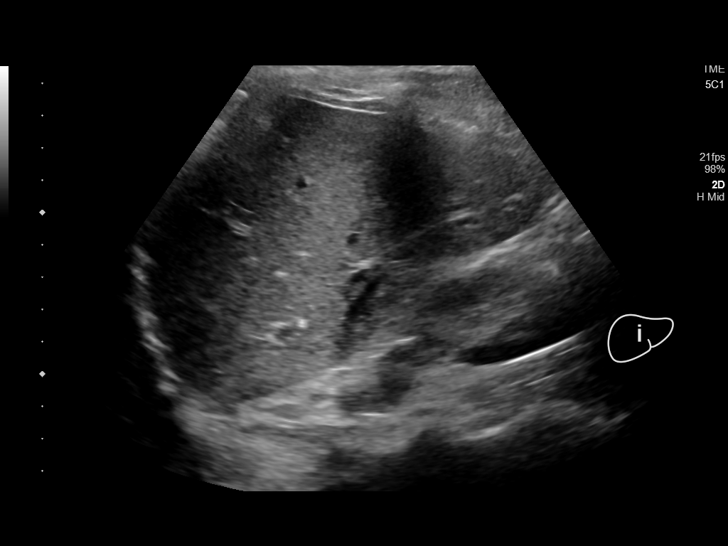
[im 56/56]
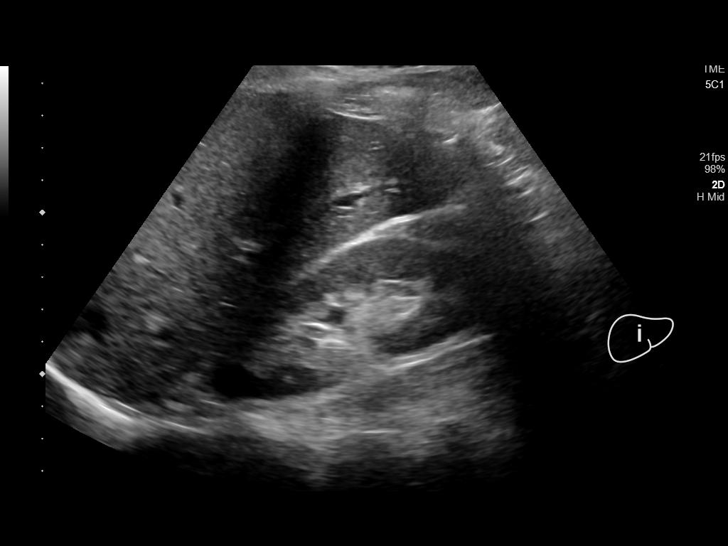

[14 of 25 positions shown; findings below may reference images not displayed]

FINDINGS: Gallbladder:

There is a large amount of sludge within the gallbladder. No
gallbladder wall thickening or pericholecystic fluid. Negative
sonographic Murphy's sign.

Common bile duct:

Diameter: 3 mm

Liver:

No focal lesion identified. Within normal limits in parenchymal
echogenicity. Portal vein is patent on color Doppler imaging with
normal direction of blood flow towards the liver.
IMPRESSION: Gallbladder sludge.  No sonographic evidence of acute cholecystitis.

## 2022-01-04 IMAGING — MR MR CERVICAL SPINE WO/W CM
5 of 8 series · 30 of 48 positions shown · IV contrast (gadavist)
Comparison: None.

CLINICAL DATA: Myelopathy, acute or progressive.

EXAM:
MRI CERVICAL SPINE WITHOUT AND WITH CONTRAST
TECHNIQUE: Multiplanar and multiecho pulse sequences of the cervical spine, to
include the craniocervical junction and cervicothoracic junction,
were obtained without and with intravenous contrast.
CONTRAST:  7.5mL GADAVIST GADOBUTROL 1 MMOL/ML IV SOLN

[Series 5: T2 · sagittal · 3.0mm · 0.62mm/px · 4 of 15 slices shown (1 of 2)]
[im 1/15]
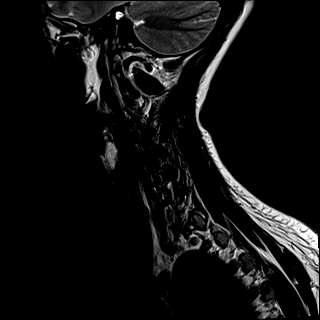
[im 5/15]
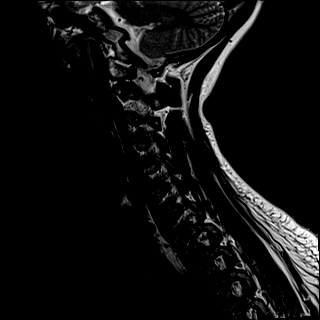
[im 10/15]
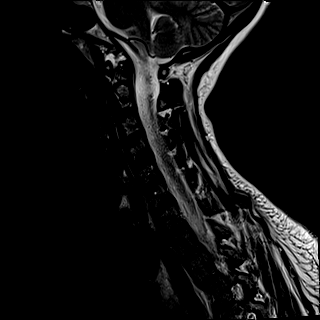
[im 15/15]
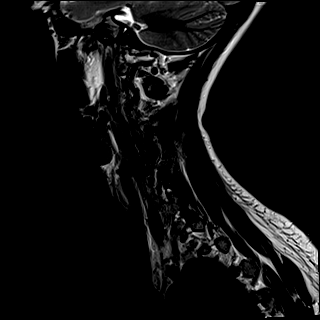

[Series 7: STIR · sagittal · 3.0mm · 0.62mm/px · 4 of 15 slices shown]
[im 1/15]
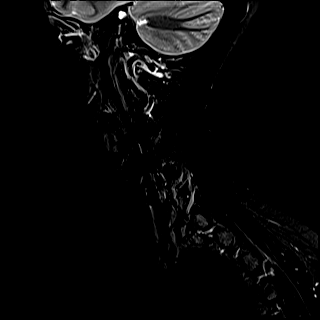
[im 5/15]
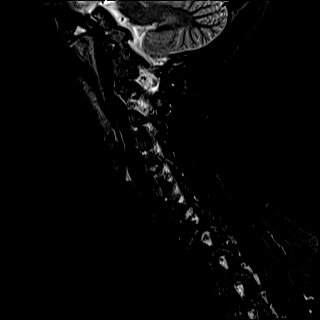
[im 10/15]
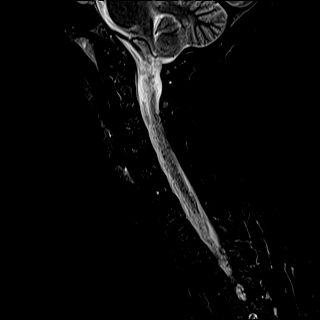
[im 15/15]
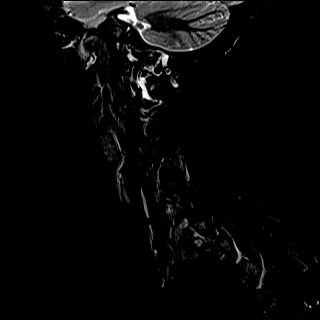

[Series 8: T2 · axial · 3.0mm · 0.70mm/px · z∈[-126,-32]mm · 8 of 29 slices shown (2 of 2)]
[im 1/29]
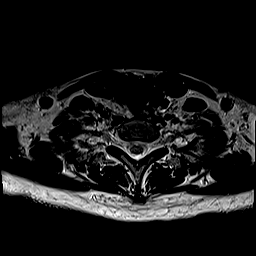
[im 5/29]
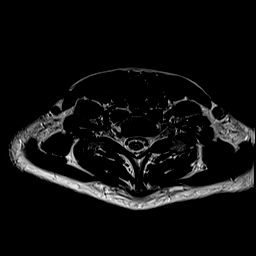
[im 9/29]
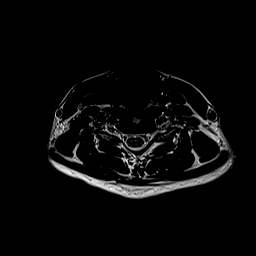
[im 13/29]
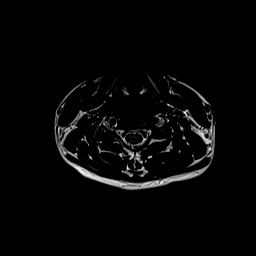
[im 17/29]
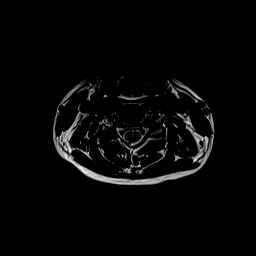
[im 21/29]
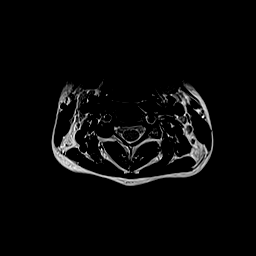
[im 25/29]
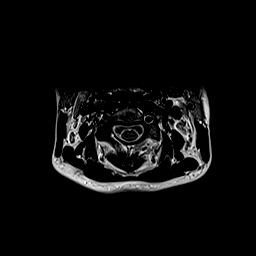
[im 29/29]
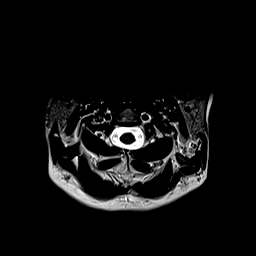

[Series 10: T1 · axial · non-contrast · 3.0mm · 0.35mm/px · z∈[-126,-32]mm · 8 of 29 slices shown]
[im 1/29]
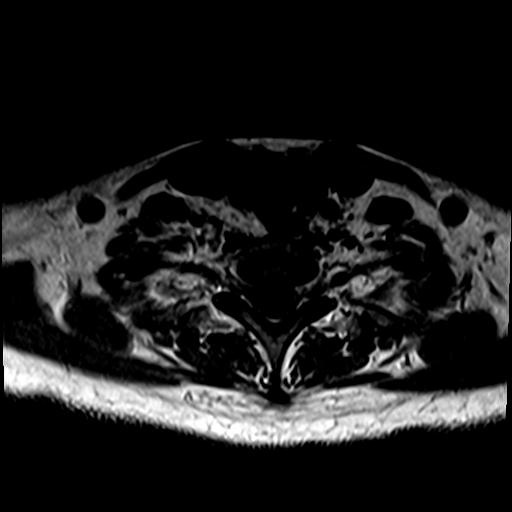
[im 5/29]
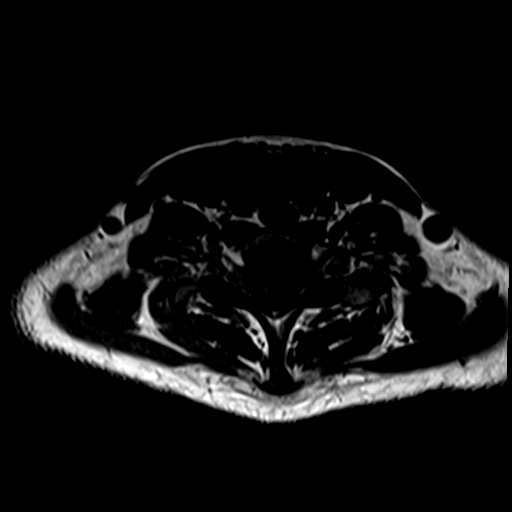
[im 9/29]
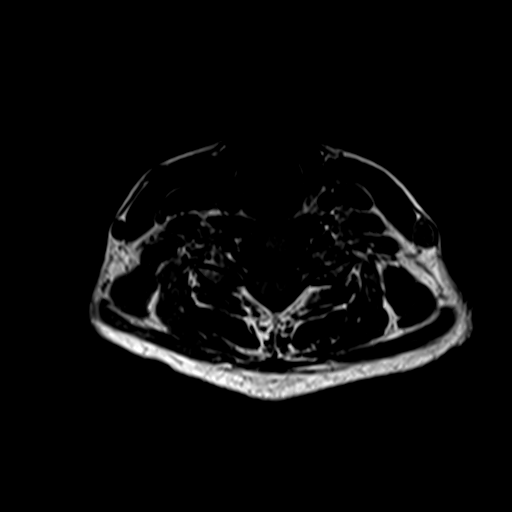
[im 13/29]
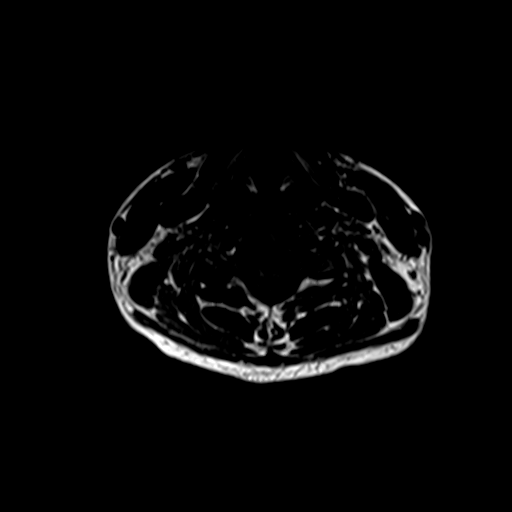
[im 17/29]
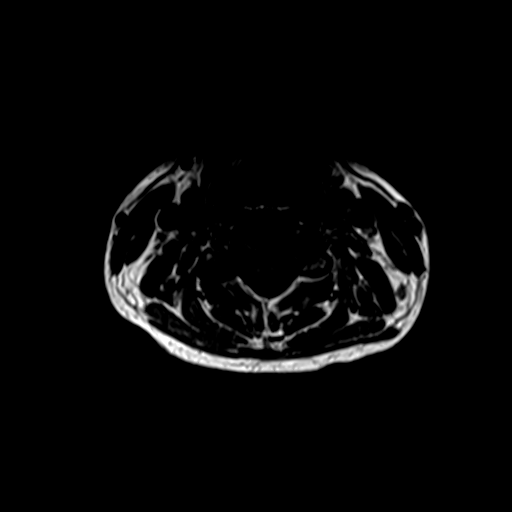
[im 21/29]
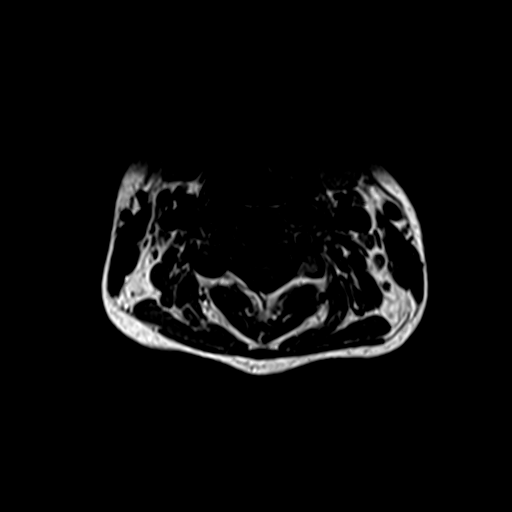
[im 25/29]
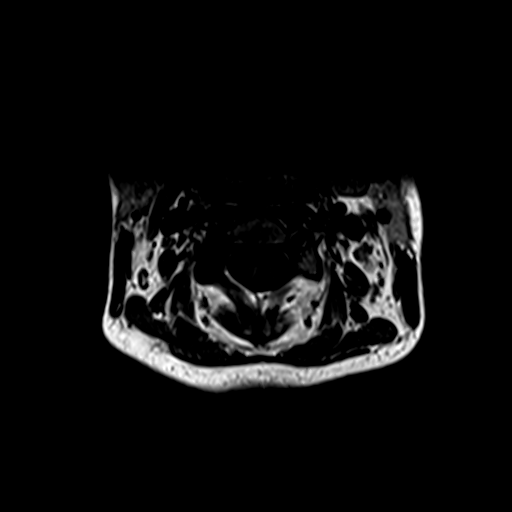
[im 29/29]
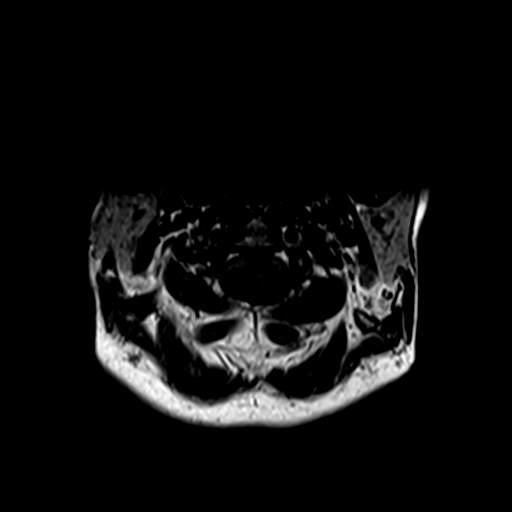

[Series 12: T1 post-contrast · axial · 3.0mm · 0.35mm/px · z∈[-126,-59]mm · 6 of 29 slices shown]
[im 1/29]
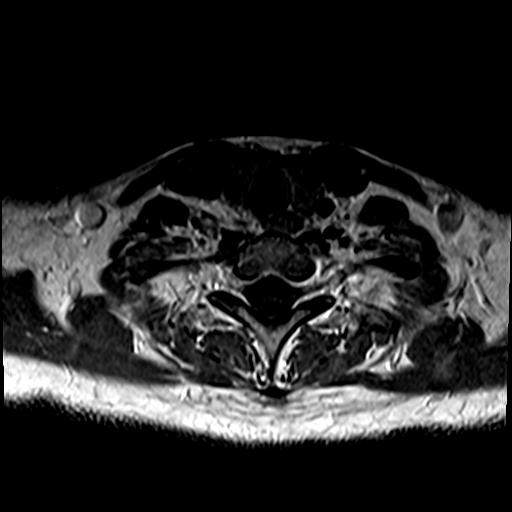
[im 5/29]
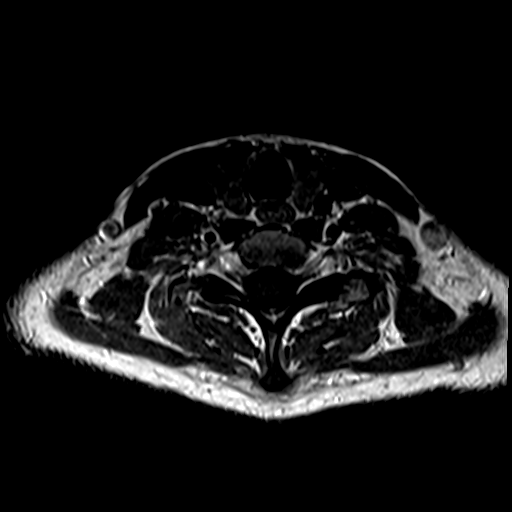
[im 9/29]
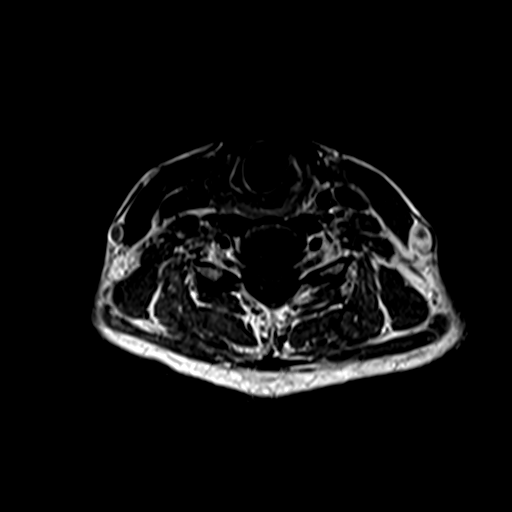
[im 13/29]
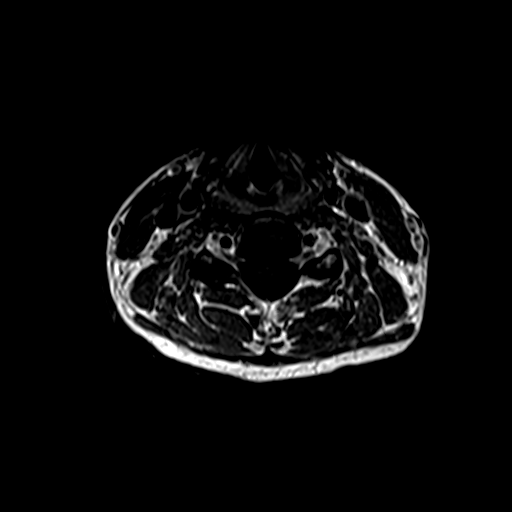
[im 17/29]
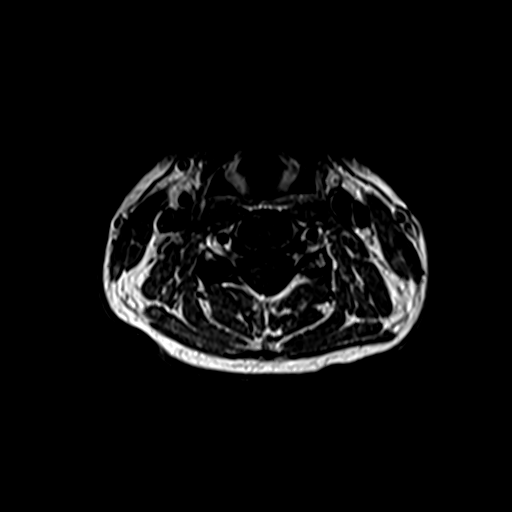
[im 21/29]
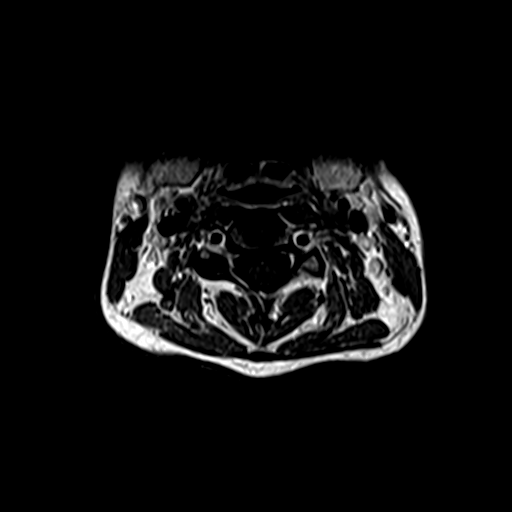

[30 of 48 positions shown; findings below may reference images not displayed]

FINDINGS: Alignment: Normal

Vertebrae: Normal bone marrow.  Negative for fracture or mass.

Cord: Normal signal and morphology.  Normal enhancement of the cord.

Posterior Fossa, vertebral arteries, paraspinal tissues: Negative

Disc levels:

Disc spaces normal.  No disc protrusion or spinal stenosis.
IMPRESSION: Negative MRI cervical spine with contrast.  Normal cervical cord.

## 2022-02-11 ENCOUNTER — Ambulatory Visit
Admission: RE | Admit: 2022-02-11 | Discharge: 2022-02-11 | Disposition: A | Discharge status: Home / Self Care | Payer: BC Managed Care – PPO | Source: Ambulatory Visit | Attending: Emergency Medicine | Admitting: Emergency Medicine

## 2022-02-11 VITALS — BP 126/85 | HR 86 | Temp 98.0°F | Resp 18

## 2022-02-11 DIAGNOSIS — H6691 Otitis media, unspecified, right ear: Secondary | ICD-10-CM

## 2022-02-11 MED ORDER — AMOXICILLIN 875 MG PO TABS: 875.0000 mg | ORAL_TABLET | Freq: Two times a day (BID) | ORAL | 0 refills | Status: AC

## 2022-02-11 NOTE — ED Provider Notes (Signed)
Gabriela Davidson    CSN: [id number] Arrival date & time: 02/11/22  6063      History   Chief Complaint Chief Complaint  Patient presents with   Headache    Sinus pressure, inside of right ear is painful - Entered by patient    HPI Gabriela Davidson is a 27 y.o. female. Patient presents with right ear pain x 1 day.  Treatment at home with Tylenol.  No fever, sore throat, cough, shortness of breath, or other symptoms.  Patient was seen by [facility] on 01/29/2022; diagnosed with RSV; treated symptomatically.   The history is provided by the patient and medical records.    Past Medical History:  Diagnosis Date   Acne    Takes spironolactone for acne   Acute ischemic stroke [facility])    Allergic rhinitis    Gestational hypertension, third trimester 12/28/2018   Pre-eclampsia     Patient Active Problem List   Diagnosis Date Noted   Acute left-sided weakness 12/16/2019    History reviewed. No pertinent surgical history.  OB History     Gravida  1   Para  1   Term  1   Preterm      AB      Living  1      SAB      IAB      Ectopic      Multiple  0   Live Births  1            Home Medications    Prior to Admission medications   Medication Sig Start Date End Date Taking? Authorizing Provider  amoxicillin (AMOXIL) 875 MG tablet Take 1 tablet (875 mg total) by mouth 2 (two) times daily for 10 days. 02/11/22 02/21/22 Yes Sharion Balloon, NP  desloratadine (CLARINEX) 5 MG tablet Take 5 mg by mouth daily.    [provider]  spironolactone (ALDACTONE) 50 MG tablet Take 50 mg by mouth 2 (two) times daily.    [provider]    Family History History reviewed. No pertinent family history.  Social History Social History   Tobacco Use   Smoking status: Never   Smokeless tobacco: Never  Vaping Use   Vaping Use: Never used  Substance Use Topics   Alcohol use: Never   Drug use: Never     Allergies   Patient has no known  allergies.   Review of Systems Review of Systems  Constitutional:  Negative for chills and fever.  HENT:  Positive for ear pain. Negative for ear discharge, hearing loss and sore throat.   Respiratory:  Negative for cough and shortness of breath.   Cardiovascular:  Negative for chest pain and palpitations.  Gastrointestinal:  Negative for nausea and vomiting.  Skin:  Negative for rash.  All other systems reviewed and are negative.    Physical Exam Triage Vital Signs ED Triage Vitals  Enc Vitals Group     BP 02/11/22 0835 126/85     Pulse Rate 02/11/22 0835 86     Resp 02/11/22 0835 18     Temp 02/11/22 0835 98 F (36.7 C)     Temp src --      SpO2 02/11/22 0835 97 %     Weight --      Height --      Head Circumference --      Peak Flow --      Pain Score 02/11/22 0837 3  Pain Loc --      Pain Edu? --      Excl. in [place]? --    No data found.  Updated Vital Signs BP 126/85   Pulse 86   Temp 98 F (36.7 C)   Resp 18   LMP 01/15/2022   SpO2 97%   Visual Acuity Right Eye Distance:   Left Eye Distance:   Bilateral Distance:    Right Eye Near:   Left Eye Near:    Bilateral Near:     Physical Exam Vitals and nursing note reviewed.  Constitutional:      General: She is not in acute distress.    Appearance: Normal appearance. She is well-developed. She is not ill-appearing.  HENT:     Right Ear: Tympanic membrane is erythematous.     Left Ear: Tympanic membrane and ear canal normal. Tympanic membrane is not erythematous.     Nose: Nose normal.     Mouth/Throat:     Mouth: Mucous membranes are moist.     Pharynx: Oropharynx is clear.  Cardiovascular:     Rate and Rhythm: Normal rate and regular rhythm.     Heart sounds: Normal heart sounds.  Pulmonary:     Effort: Pulmonary effort is normal. No respiratory distress.     Breath sounds: Normal breath sounds.  Musculoskeletal:     Cervical back: Neck supple.  Skin:    General: Skin is warm and dry.   Neurological:     Mental Status: She is alert.  Psychiatric:        Mood and Affect: Mood normal.        Behavior: Behavior normal.      UC Treatments / Results  Labs (all labs ordered are listed, but only abnormal results are displayed) Labs Reviewed - No data to display  EKG   Radiology No results found.  Procedures Procedures (including critical care time)  Medications Ordered in UC Medications - No data to display  Initial Impression / Assessment and Plan / UC Course  I have reviewed the triage vital signs and the nursing notes.  Pertinent labs & imaging results that were available during my care of the patient were reviewed by me and considered in my medical decision making (see chart for details).    Right otitis media.  Treating with amoxicillin.  Discussed symptomatic treatment including Tylenol, rest, hydration.  Instructed patient to follow up with PCP if symptoms are not improving.  She agrees to plan of care.   Final Clinical Impressions(s) / UC Diagnoses   Final diagnoses:  Right otitis media, unspecified otitis media type     Discharge Instructions      Take the amoxicillin as directed.  Follow up with your primary care provider if your symptoms are not improving.        ED Prescriptions     Medication Sig Dispense Auth. Provider   amoxicillin (AMOXIL) 875 MG tablet Take 1 tablet (875 mg total) by mouth 2 (two) times daily for 10 days. 20 tablet Sharion Balloon, NP      PDMP not reviewed this encounter.   Sharion Balloon, NP 02/11/22 [phone]

## 2022-02-11 NOTE — Discharge Instructions (Addendum)
Take the amoxicillin as directed.  Follow up with your primary care provider if your symptoms are not improving.   ° ° °

## 2022-02-11 NOTE — ED Triage Notes (Signed)
Patient to Urgent Care with complaints of right sided ear pain/ fullness and sinus pressure.  Reports symptoms started yesterday afternoon.   Diagnosed with RSV 2-3 weeks ago. Was told she did have some fluid in her ear at that time.   Has been taking tylenol.

## 2022-05-11 ENCOUNTER — Ambulatory Visit
Admission: EM | Admit: 2022-05-11 | Discharge: 2022-05-11 | Disposition: A | Discharge status: Home / Self Care | Payer: BC Managed Care – PPO

## 2022-05-11 DIAGNOSIS — B349 Viral infection, unspecified: Secondary | ICD-10-CM | POA: Diagnosis not present

## 2022-05-11 NOTE — ED Provider Notes (Signed)
Renaldo Fiddler    CSN: [id number] Arrival date & time: 05/11/22  1255      History   Chief Complaint Chief Complaint  Patient presents with   Chills    Headache, body ache, sore throat, ears - Entered by patient    HPI DENESE MENTINK is a 27 y.o. female. Patient presents with 1 day history of  chills, body aches, headache, ear pain, sore throat.  Treating with ibuprofen; none taken today.  No fever, cough, shortness of breath, vomiting, diarrhea, or other symptoms.  Patient was seen here on 02/11/2022; diagnosed with right otitis media; treated with amoxicillin.  Her medical history includes stroke.    The history is provided by the patient and medical records.    Past Medical History:  Diagnosis Date   Acne    Takes spironolactone for acne   Acute ischemic stroke [facility])    Allergic rhinitis    Gestational hypertension, third trimester 12/28/2018   Pre-eclampsia     Patient Active Problem List   Diagnosis Date Noted   Acute left-sided weakness 12/16/2019    No past surgical history on file.  OB History     Gravida  1   Para  1   Term  1   Preterm      AB      Living  1      SAB      IAB      Ectopic      Multiple  0   Live Births  1            Home Medications    Prior to Admission medications   Medication Sig Start Date End Date Taking? Authorizing Provider  desloratadine (CLARINEX) 5 MG tablet Take 5 mg by mouth daily.    [provider]  spironolactone (ALDACTONE) 50 MG tablet Take 50 mg by mouth 2 (two) times daily.    [provider]    Family History No family history on file.  Social History Social History   Tobacco Use   Smoking status: Never   Smokeless tobacco: Never  Vaping Use   Vaping Use: Never used  Substance Use Topics   Alcohol use: Never   Drug use: Never     Allergies   Patient has no known allergies.   Review of Systems Review of Systems  Constitutional:  Positive for  chills. Negative for fever.  HENT:  Positive for ear pain and sore throat.   Respiratory:  Negative for cough and shortness of breath.   Cardiovascular:  Negative for chest pain and palpitations.  Gastrointestinal:  Negative for abdominal pain, diarrhea and vomiting.  Genitourinary:  Negative for dysuria and hematuria.  Skin:  Negative for rash.  Neurological:  Positive for headaches.  All other systems reviewed and are negative.    Physical Exam Triage Vital Signs ED Triage Vitals  Enc Vitals Group     BP      Pulse      Resp      Temp      Temp src      SpO2      Weight      Height      Head Circumference      Peak Flow      Pain Score      Pain Loc      Pain Edu?      Excl. in [place]?    No data found.  Updated Vital Signs BP 114/79   Pulse (!) 108   Temp 98.5 F (36.9 C)   Resp 18   LMP 04/29/2022   SpO2 98%   Visual Acuity Right Eye Distance:   Left Eye Distance:   Bilateral Distance:    Right Eye Near:   Left Eye Near:    Bilateral Near:     Physical Exam Vitals and nursing note reviewed.  Constitutional:      General: She is not in acute distress.    Appearance: Normal appearance. She is well-developed. She is not ill-appearing.  HENT:     Right Ear: Tympanic membrane normal.     Left Ear: Tympanic membrane normal.     Nose: Nose normal.     Mouth/Throat:     Mouth: Mucous membranes are moist.     Pharynx: Oropharynx is clear.  Cardiovascular:     Rate and Rhythm: Normal rate and regular rhythm.     Heart sounds: Normal heart sounds.  Pulmonary:     Effort: Pulmonary effort is normal. No respiratory distress.     Breath sounds: Normal breath sounds.  Abdominal:     General: Bowel sounds are normal.     Palpations: Abdomen is soft.     Tenderness: There is no abdominal tenderness. There is no guarding or rebound.  Musculoskeletal:     Cervical back: Neck supple.  Skin:    General: Skin is warm and dry.  Neurological:     Mental Status:  She is alert.  Psychiatric:        Mood and Affect: Mood normal.        Behavior: Behavior normal.      UC Treatments / Results  Labs (all labs ordered are listed, but only abnormal results are displayed) Labs Reviewed - No data to display  EKG   Radiology No results found.  Procedures Procedures (including critical care time)  Medications Ordered in UC Medications - No data to display  Initial Impression / Assessment and Plan / UC Course  I have reviewed the triage vital signs and the nursing notes.  Pertinent labs & imaging results that were available during my care of the patient were reviewed by me and considered in my medical decision making (see chart for details).   Viral illness.  Patient declines COVID test.  Discussed symptomatic treatment including Tylenol, rest, hydration.  Instructed patient to follow up with her PCP if symptoms are not improving.  Education provided on viral illness.  She agrees to plan of care.    Final Clinical Impressions(s) / UC Diagnoses   Final diagnoses:  Viral illness     Discharge Instructions      Take Tylenol as needed for fever or discomfort.  Rest and keep yourself hydrated.    Follow-up with your primary care provider if your symptoms are not improving.         ED Prescriptions   None    PDMP not reviewed this encounter.   Mickie Bail, NP 05/11/22 1323

## 2022-05-11 NOTE — ED Triage Notes (Signed)
Patient to Urgent Care with complaints of throbbing headache/ generalized body aches/ right ear fullness. Reports chills and hot flashes. No known fevers.   Symptoms started yesterday. Taking ibuprofen. Last dose last night.

## 2022-05-11 NOTE — Discharge Instructions (Addendum)
Take Tylenol as needed for fever or discomfort.  Rest and keep yourself hydrated.    Follow-up with your primary care provider if your symptoms are not improving.     

## 2022-06-21 ENCOUNTER — Encounter: Payer: Self-pay | Admitting: Emergency Medicine

## 2022-06-21 ENCOUNTER — Ambulatory Visit
Admission: EM | Admit: 2022-06-21 | Discharge: 2022-06-21 | Disposition: A | Discharge status: Home / Self Care | Payer: BC Managed Care – PPO

## 2022-06-21 DIAGNOSIS — J069 Acute upper respiratory infection, unspecified: Secondary | ICD-10-CM

## 2022-06-21 LAB — POCT RAPID STREP A (OFFICE): Rapid Strep A Screen: NEGATIVE

## 2022-06-21 NOTE — ED Provider Notes (Addendum)
Gabriela Davidson    CSN: [id number] Arrival date & time: 06/21/22  1914      History   Chief Complaint Chief Complaint  Patient presents with   Cough   Sore Throat   Nasal Congestion    HPI Gabriela Davidson is a 27 y.o. female.   HPI  Patient presents to urgent care with productive cough, nasal congestion and sore throat x 3 days.  She is accompanied by her 24-year-old daughter who is presenting with similar symptoms.  Denies fever.  Cough is productive of colored sputum.  Nasal congestion is productive of colored mucus.  endorses painful swallow.  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   Past Medical History:  Diagnosis Date   Acne    Takes spironolactone for acne   Acute ischemic stroke [facility])    Allergic rhinitis    Gestational hypertension, third trimester 12/28/2018   Pre-eclampsia     Patient Active Problem List   Diagnosis Date Noted   Acute left-sided weakness 12/16/2019    History reviewed. No pertinent surgical history.  OB History     Gravida  1   Para  1   Term  1   Preterm      AB      Living  1      SAB      IAB      Ectopic      Multiple  0   Live Births  1            Home Medications    Prior to Admission medications   Medication Sig Start Date End Date Taking? Authorizing Provider  desloratadine (CLARINEX) 5 MG tablet Take 5 mg by mouth daily.    [provider]  EPINEPHrine 0.3 mg/0.3 mL IJ SOAJ injection See admin instructions.    [provider]  spironolactone (ALDACTONE) 50 MG tablet Take 50 mg by mouth 2 (two) times daily.    [provider]    Family History No family history on file.  Social History Social History   Tobacco Use   Smoking status: Never   Smokeless tobacco: Never  Vaping Use   Vaping Use: Never used  Substance Use Topics   Alcohol use: Never   Drug use: Never     Allergies   Patient has no known  allergies.   Review of Systems Review of Systems   Physical Exam Triage Vital Signs ED Triage Vitals  Enc Vitals Group     BP 06/21/22 1014 119/78     Pulse Rate 06/21/22 1014 81     Resp 06/21/22 1014 16     Temp 06/21/22 1014 98.1 F (36.7 C)     Temp Source 06/21/22 1014 Oral     SpO2 06/21/22 1014 97 %     Weight --      Height --      Head Circumference --      Peak Flow --      Pain Score 06/21/22 1017 0     Pain Loc --      Pain Edu? --      Excl. in [place]? --    No data found.  Updated Vital Signs BP 119/78 (BP Location: Left Arm)   Pulse 81   Temp 98.1 F (36.7 C) (Oral)   Resp 16   LMP 06/20/2022   SpO2 97%   Visual Acuity Right Eye Distance:  Left Eye Distance:   Bilateral Distance:    Right Eye Near:   Left Eye Near:    Bilateral Near:     Physical Exam   UC Treatments / Results  Labs (all labs ordered are listed, but only abnormal results are displayed) Labs Reviewed  POCT RAPID STREP A (OFFICE)    EKG   Radiology No results found.  Procedures Procedures (including critical care time)  Medications Ordered in UC Medications - No data to display  Initial Impression / Assessment and Plan / UC Course  I have reviewed the triage vital signs and the nursing notes.  Pertinent labs & imaging results that were available during my care of the patient were reviewed by me and considered in my medical decision making (see chart for details).   Gabriela Davidson is a 27 y.o. female presenting with URI symptoms including sore throat. Patient is afebrile without recent antipyretics, satting well on room air. Overall is well appearing, well hydrated, without respiratory distress. Pulmonary exam is unremarkable.  Lungs CTAB without wheezing, rhonchi, rales. RRR.  Very mild pharyngeal erythema is present.  No peritonsillar exudates.  TMs are WNL bilaterally.  Reviewed relevant chart history.  Rapid strep result is negative.  No red flag symptoms  suggestive of peritonsillar abscess.  Symptoms are consistent with an acute viral process.  Recommending supportive care and continued use of OTC medication for symptom.  Counseled patient on potential for adverse effects with medications prescribed/recommended today, ER and return-to-clinic precautions discussed, patient verbalized understanding and agreement with care plan.  Final Clinical Impressions(s) / UC Diagnoses   Final diagnoses:  None   Discharge Instructions   None    ED Prescriptions   None    PDMP not reviewed this encounter.   Charma Igo, FNP 06/21/22 1038    ImmordinoJeannett Senior, [place] 06/21/22 1039

## 2022-06-21 NOTE — Discharge Instructions (Addendum)
Follow up here or with your primary care provider if your symptoms are worsening or not improving.     

## 2022-06-21 NOTE — ED Triage Notes (Signed)
Pt presents with productive cough, nasal congestion and sore throat x 3 days.

## 2022-08-06 ENCOUNTER — Inpatient Hospital Stay (HOSPITAL_COMMUNITY)
Admission: AD | Admit: 2022-08-06 | Discharge: 2022-08-06 | Disposition: A | Discharge status: Home / Self Care | Payer: BC Managed Care – PPO | Attending: Obstetrics and Gynecology | Admitting: Obstetrics and Gynecology

## 2022-08-06 ENCOUNTER — Ambulatory Visit: Payer: Self-pay

## 2022-08-06 ENCOUNTER — Encounter (HOSPITAL_COMMUNITY): Payer: Self-pay | Admitting: Obstetrics and Gynecology

## 2022-08-06 DIAGNOSIS — O210 Mild hyperemesis gravidarum: Secondary | ICD-10-CM | POA: Diagnosis present

## 2022-08-06 DIAGNOSIS — R112 Nausea with vomiting, unspecified: Secondary | ICD-10-CM | POA: Diagnosis not present

## 2022-08-06 DIAGNOSIS — E860 Dehydration: Secondary | ICD-10-CM | POA: Diagnosis not present

## 2022-08-06 DIAGNOSIS — Z3A01 Less than 8 weeks gestation of pregnancy: Secondary | ICD-10-CM | POA: Insufficient documentation

## 2022-08-06 DIAGNOSIS — O211 Hyperemesis gravidarum with metabolic disturbance: Secondary | ICD-10-CM | POA: Insufficient documentation

## 2022-08-06 LAB — URINALYSIS, ROUTINE W REFLEX MICROSCOPIC
Bilirubin Urine: NEGATIVE
Glucose, UA: NEGATIVE mg/dL
Hgb urine dipstick: NEGATIVE
Ketones, ur: 80 mg/dL — AB
Nitrite: NEGATIVE
Protein, ur: 30 mg/dL — AB
Specific Gravity, Urine: 1.028 (ref 1.005–1.030)
pH: 6 (ref 5.0–8.0)

## 2022-08-06 LAB — POCT PREGNANCY, URINE: Preg Test, Ur: POSITIVE — AB

## 2022-08-06 MED ORDER — ONDANSETRON HCL 4 MG/2ML IJ SOLN
4.0000 mg | Freq: Once | INTRAMUSCULAR | Status: AC
  Administered 2022-08-06: 4 mg via INTRAVENOUS
  Filled 2022-08-06: qty 2

## 2022-08-06 MED ORDER — ONDANSETRON 4 MG PO TBDP: 4.0000 mg | ORAL_TABLET | Freq: Three times a day (TID) | ORAL | 0 refills | Status: DC | PRN

## 2022-08-06 MED ORDER — LACTATED RINGERS IV BOLUS
1000.0000 mL | Freq: Once | INTRAVENOUS | Status: AC
  Administered 2022-08-06: 1000 mL via INTRAVENOUS

## 2022-08-06 NOTE — MAU Note (Signed)
Gabriela Davidson is a 28 y.o. at Unknown here in MAU reporting: doesn't know how far along she is, has not been seen yet, first appt is for Mon.  Can't keep the basic bland food or water for the last couple days. is 2nd preg.  Pain in lower abd, pulling when she throws up. LMP: 6/8 Onset of complaint: few days Pain score: 7 Vitals:   08/06/22 1150  BP: 126/84  Pulse: 85  Resp: 16  Temp: 98.6 F (37 C)  SpO2: 100%      Lab orders placed from triage:  UA/UPT

## 2022-08-06 NOTE — MAU Provider Note (Signed)
History     CSN: [id number]  Arrival date and time: 08/06/22 1117   Event Date/Time   First Provider Initiated Contact with Patient 08/06/22 1519      Chief Complaint  Patient presents with   Possible Pregnancy   Emesis   Gabriela Davidson , a  27 y.o. G2P1001 at [redacted]w[redacted]d presents to MAU with complaints of nausea and vomiting in early pregnancy. Patient states that this was a similar occurrence in her previous pregnancy and the only thing that worked well for her was Zofran. She states that in the last 3 days her vomiting has gotten increasingly worse and states that "she can't even keep water down now." She reports in the last 36 hours more than 20 episodes of vomiting and 4 since arrival to MAU. Attempted OTC diclegis and [place] without relief. She denies vaginal bleeding, leaking of fluid and contractions. She notes some mild abdominal cramping earlier, but none at this time.          OB History     Gravida  2   Para  1   Term  1   Preterm      AB      Living  1      SAB      IAB      Ectopic      Multiple  0   Live Births  1           Past Medical History:  Diagnosis Date   Acne    Takes spironolactone for acne   Acute ischemic stroke (HCC)    2021- never proven   Allergic rhinitis    Gestational hypertension, third trimester 12/28/2018   Pre-eclampsia     Past Surgical History:  Procedure Laterality Date   NO PAST SURGERIES      Family History  Problem Relation Age of Onset   Healthy Mother    Hypertension Father    Cancer Maternal Grandmother     Social History   Tobacco Use   Smoking status: Never   Smokeless tobacco: Never  Vaping Use   Vaping status: Never Used  Substance Use Topics   Alcohol use: Not Currently   Drug use: Never    Allergies: No Known Allergies  Medications Prior to Admission  Medication Sig Dispense Refill Last Dose   desloratadine (CLARINEX) 5 MG tablet Take 5 mg by mouth daily.   Past Month    EPINEPHrine 0.3 mg/0.3 mL IJ SOAJ injection See admin instructions.      spironolactone (ALDACTONE) 50 MG tablet Take 50 mg by mouth 2 (two) times daily.       Review of Systems  Constitutional:  Negative for chills, fatigue and fever.  Eyes:  Negative for pain and visual disturbance.  Respiratory:  Negative for apnea, shortness of breath and wheezing.   Cardiovascular:  Negative for chest pain and palpitations.  Gastrointestinal:  Positive for abdominal pain, nausea and vomiting. Negative for constipation and diarrhea.  Genitourinary:  Negative for difficulty urinating, dysuria, pelvic pain, vaginal bleeding, vaginal discharge and vaginal pain.  Musculoskeletal:  Negative for back pain.  Neurological:  Negative for seizures, weakness and headaches.  Psychiatric/Behavioral:  Negative for suicidal ideas.    Physical Exam   Blood pressure 121/73, pulse 70, temperature 98.6 F (37 C), temperature source Oral, resp. rate 16, height 5\' 1"  (1.549 m), weight 69.4 kg, last menstrual period 06/20/2022, SpO2 100%.  Physical Exam Vitals and nursing note reviewed.  Constitutional:      General: She is not in acute distress.    Appearance: Normal appearance.  HENT:     Head: Normocephalic.  Pulmonary:     Effort: Pulmonary effort is normal.  Abdominal:     Palpations: Abdomen is soft.     Tenderness: There is no abdominal tenderness. There is no guarding.  Musculoskeletal:     Cervical back: Normal range of motion.  Skin:    General: Skin is warm and dry.     Capillary Refill: Capillary refill takes more than 3 seconds.  Neurological:     Mental Status: She is alert and oriented to person, place, and time.  Psychiatric:        Mood and Affect: Mood normal.     MAU Course  Procedures Orders Placed This Encounter  Procedures   Urinalysis, Routine w reflex microscopic -Urine, Clean Catch   Pregnancy, urine POC   Insert peripheral IV   Meds ordered this encounter  Medications    ondansetron (ZOFRAN) injection 4 mg   lactated ringers bolus 1,000 mL    MDM - Ketones of 80, IV fluids and antiemetics ordered.  - Larage amounts of Leuks and bacteria noted, UA reflexed to culture.  - PO Challenge ar 1600 - PO challenge successful and patient tolerated well.  - plan for discharge.   Assessment and Plan   1. Nausea and vomiting, unspecified vomiting type   2. [redacted] weeks gestation of pregnancy   3. Mild dehydration    - Reviewed nausea nd vomiting is a normal discomfort of pregnancy.  - Rx for zofran sent to outpatient pharmacy for patient to pick up.  - Admin instructions provided at bedside and encouraged the use of a stool softener to decrease risk of constipation with use. Patient verbalized understanding.  - Patient hade OB appointment scheduled for next week in [place], encouraged to keep appointment  - Worsening signs and return precautions reviewed - Patient discharged home in stable condition and may return to MAU as needed.   Claudette Head, MSN  CNM  08/06/2022, 3:19 PM

## 2022-08-14 DIAGNOSIS — Z3480 Encounter for supervision of other normal pregnancy, unspecified trimester: Secondary | ICD-10-CM | POA: Insufficient documentation

## 2022-08-16 ENCOUNTER — Emergency Department
Admission: EM | Admit: 2022-08-16 | Discharge: 2022-08-16 | Disposition: A | Discharge status: Home / Self Care | Payer: BC Managed Care – PPO | Attending: Emergency Medicine | Admitting: Emergency Medicine

## 2022-08-16 ENCOUNTER — Encounter: Payer: Self-pay | Admitting: Intensive Care

## 2022-08-16 ENCOUNTER — Other Ambulatory Visit: Payer: Self-pay

## 2022-08-16 DIAGNOSIS — O219 Vomiting of pregnancy, unspecified: Secondary | ICD-10-CM | POA: Diagnosis present

## 2022-08-16 DIAGNOSIS — Z3A08 8 weeks gestation of pregnancy: Secondary | ICD-10-CM | POA: Diagnosis not present

## 2022-08-16 DIAGNOSIS — O210 Mild hyperemesis gravidarum: Secondary | ICD-10-CM | POA: Diagnosis not present

## 2022-08-16 DIAGNOSIS — O26891 Other specified pregnancy related conditions, first trimester: Secondary | ICD-10-CM | POA: Insufficient documentation

## 2022-08-16 DIAGNOSIS — R1030 Lower abdominal pain, unspecified: Secondary | ICD-10-CM | POA: Insufficient documentation

## 2022-08-16 LAB — COMPREHENSIVE METABOLIC PANEL
ALT: 84 U/L — ABNORMAL HIGH (ref 0–44)
AST: 57 U/L — ABNORMAL HIGH (ref 15–41)
Albumin: 4 g/dL (ref 3.5–5.0)
Alkaline Phosphatase: 61 U/L (ref 38–126)
Anion gap: 8 (ref 5–15)
BUN: 10 mg/dL (ref 6–20)
CO2: 20 mmol/L — ABNORMAL LOW (ref 22–32)
Calcium: 9.2 mg/dL (ref 8.9–10.3)
Chloride: 106 mmol/L (ref 98–111)
Creatinine, Ser: 0.66 mg/dL (ref 0.44–1.00)
GFR, Estimated: >60 mL/min (ref >60)
Glucose, Bld: 107 mg/dL — ABNORMAL HIGH (ref 70–99)
Potassium: 3.4 mmol/L — ABNORMAL LOW (ref 3.5–5.1)
Sodium: 134 mmol/L — ABNORMAL LOW (ref 135–145)
Total Bilirubin: 1.3 mg/dL — ABNORMAL HIGH (ref 0.3–1.2)
Total Protein: 7.9 g/dL (ref 6.5–8.1)

## 2022-08-16 LAB — URINALYSIS, ROUTINE W REFLEX MICROSCOPIC
Bilirubin Urine: NEGATIVE
Glucose, UA: NEGATIVE mg/dL
Hgb urine dipstick: NEGATIVE
Ketones, ur: 80 mg/dL — AB
Nitrite: NEGATIVE
Protein, ur: 30 mg/dL — AB
Specific Gravity, Urine: 1.025 (ref 1.005–1.030)
pH: 6 (ref 5.0–8.0)

## 2022-08-16 LAB — LIPASE, BLOOD: Lipase: 49 U/L (ref 11–51)

## 2022-08-16 LAB — CBC
HCT: 35.5 % — ABNORMAL LOW (ref 36.0–46.0)
Hemoglobin: 12.8 g/dL (ref 12.0–15.0)
MCH: 29.2 pg (ref 26.0–34.0)
MCHC: 36.1 g/dL — ABNORMAL HIGH (ref 30.0–36.0)
MCV: 80.9 fL (ref 80.0–100.0)
Platelets: 346 10*3/uL (ref 150–400)
RBC: 4.39 MIL/uL (ref 3.87–5.11)
RDW: 12.1 % (ref 11.5–15.5)
WBC: 9.3 10*3/uL (ref 4.0–10.5)
nRBC: 0 % (ref 0.0–0.2)

## 2022-08-16 MED ORDER — DEXTROSE-SODIUM CHLORIDE 5-0.9 % IV SOLN
1000.0000 mL | Freq: Once | INTRAVENOUS | Status: AC
  Administered 2022-08-16: 1000 mL via INTRAVENOUS

## 2022-08-16 MED ORDER — PREDNISONE 10 MG PO TABS: ORAL_TABLET | ORAL | 0 refills | Status: DC

## 2022-08-16 MED ORDER — METOCLOPRAMIDE HCL 5 MG/ML IJ SOLN
10.0000 mg | INTRAMUSCULAR | Status: AC
  Administered 2022-08-16: 10 mg via INTRAVENOUS
  Filled 2022-08-16: qty 2

## 2022-08-16 MED ORDER — METHYLPREDNISOLONE SODIUM SUCC 40 MG IJ SOLR
16.0000 mg | INTRAMUSCULAR | Status: AC
  Administered 2022-08-16: 16 mg via INTRAVENOUS
  Filled 2022-08-16: qty 1

## 2022-08-16 NOTE — ED Notes (Signed)
Blankets provided per request.

## 2022-08-16 NOTE — ED Triage Notes (Addendum)
Patient is [redacted] weeks pregnant. Patient reports she is puking up black and brown bile. Reports she is taking zofran and reglan. Constantly nauseas. Reports her throat is burning from all the vomiting. States her urine is brown. C/o abdominal pain  Denies vaginal discharge.

## 2022-08-16 NOTE — ED Notes (Signed)
Pt to ED at about 2 months pregnancy, EDD 03/27/23. G2P1, experiencing vomiting vomiting 6-8 times per day, worse since last week. Pt takes zofran at home. Denies vaginal bleeding. Endorses some lower abdominal cramping and acid reflux. In NAD. Labs sent in triage.

## 2022-08-16 NOTE — Discharge Instructions (Signed)
Please read through the included information and continue taking your previously prescribed antinausea medicines.  We also wrote your prescription for a course of low-dose steroids which may help prevent your persistent nausea and vomiting.  If you are not already taking an antacid such as Prilosec or famotidine, we strongly encourage you to do so to try and protect your esophagus from your vomiting as well as from the steroids which occasionally can cause some stomach upset as well.  Please follow-up with Kathrin Ruddy at the next available opportunity.  Insert return statement.

## 2022-08-16 NOTE — ED Provider Notes (Signed)
[facility] Provider Note    Event Date/Time   First MD Initiated Contact with Patient 08/16/22 1007     (approximate)   History   Emesis During Pregnancy   HPI RUBYLEE ZAMARRIPA is a 27 y.o. female G2, P1 at approximately [redacted] weeks gestation.  She goes to [facility] [facility] and sees Kathrin Ruddy.  She presents for evaluation of persistent vomiting.  She says she has vomited so much that she has a burning sensation all throughout her chest and throat and down to the middle of her upper abdomen.  She cannot keep anything down despite having been seen in clinic several days ago and try medications at home that include likely just, oral Reglan, and oral Zofran.  She occasionally has some cramping pain in the lower part of her abdomen, more often on the right than not, but no persistent pain.  She has not had any fever.  Sometimes the pain somewhat makes her breath be taken away but she is otherwise not short of breath.  She says that she had "gallbladder sludge" when she was pregnant the last time.     Physical Exam   Triage Vital Signs: ED Triage Vitals  Encounter Vitals Group     BP 08/16/22 0942 121/82     Systolic BP Percentile --      Diastolic BP Percentile --      Pulse Rate 08/16/22 0942 85     Resp 08/16/22 0942 18     Temp 08/16/22 0942 98.2 F (36.8 C)     Temp Source 08/16/22 0942 Oral     SpO2 08/16/22 0942 100 %     Weight 08/16/22 0944 67.1 kg (148 lb)     Height 08/16/22 0944 1.549 m (5\' 1" )     Head Circumference --      Peak Flow --      Pain Score 08/16/22 0944 0     Pain Loc --      Pain Education --      Exclude from Growth Chart --     Most recent vital signs: Vitals:   08/16/22 0942 08/16/22 1355  BP: 121/82 122/82  Pulse: 85 88  Resp: 18 18  Temp: 98.2 F (36.8 C) 98.3 F (36.8 C)  SpO2: 100% 99%    General: Awake, no obvious distress, appears generally well although likely volume depleted. CV:  Good peripheral  perfusion.  Regular rate and rhythm. Resp:  Normal effort. Speaking easily and comfortably, no accessory muscle usage nor intercostal retractions.   Abd:  No distention.  No tenderness to palpation.   ED Results / Procedures / Treatments   Labs (all labs ordered are listed, but only abnormal results are displayed) Labs Reviewed  COMPREHENSIVE METABOLIC PANEL - Abnormal; Notable for the following components:      Result Value   Sodium 134 (*)    Potassium 3.4 (*)    CO2 20 (*)    Glucose, Bld 107 (*)    AST 57 (*)    ALT 84 (*)    Total Bilirubin 1.3 (*)    All other components within normal limits  CBC - Abnormal; Notable for the following components:   HCT 35.5 (*)    MCHC 36.1 (*)    All other components within normal limits  URINALYSIS, ROUTINE W REFLEX MICROSCOPIC - Abnormal; Notable for the following components:   Color, Urine AMBER (*)    APPearance HAZY (*)  Ketones, ur 80 (*)    Protein, ur 30 (*)    Leukocytes,Ua MODERATE (*)    Bacteria, UA FEW (*)    All other components within normal limits  LIPASE, BLOOD     PROCEDURES:  Critical Care performed: No  Procedures    IMPRESSION / MDM / ASSESSMENT AND PLAN / ED COURSE  I reviewed the triage vital signs and the nursing notes.                              Differential diagnosis includes, but is not limited to, hyperemesis gravidarum, biliary colic, nonspecific gastritis.  Patient's presentation is most consistent with acute presentation with potential threat to life or bodily function.  Labs/studies ordered: CBC, lipase, CMP, urinalysis  Interventions/Medications given:  Medications  dextrose 5 %-0.9 % sodium chloride infusion (0 mLs Intravenous Stopped 08/16/22 1359)  metoCLOPramide (REGLAN) injection 10 mg (10 mg Intravenous Given 08/16/22 1124)  methylPREDNISolone sodium succinate (SOLU-MEDROL) 40 mg/mL injection 16 mg (16 mg Intravenous Given 08/16/22 1124)    (Note:  hospital course my include  additional interventions and/or labs/studies not listed above.)   Patient has had outpatient ultrasound confirming single intrauterine pregnancy.  Vital signs are stable but she has been vomiting excessively.  She reports a prior history of hyperemesis as well as "gallbladder sludge" with her last pregnancy.  Labs are notable for some mild LFT elevation which is likely due to the persistent gastritis.  She has no specific tenderness to palpation of the right upper quadrant.  I will work on resuscitation with D5 normal saline 1 L IV bolus and then consider whether gallbladder ultrasound would be appropriate.  She has been on Diclegis, Reglan, and Zofran as an outpatient and I will try dose of Reglan 10 mg IV to see if it helps her feel better.  I will also add methylprednisolone 16 mg IV as per protocol to see if this also helps.  The patient is on the cardiac monitor to evaluate for evidence of arrhythmia and/or significant heart rate changes.   Clinical Course as of 08/16/22 1547  Sun Aug 16, 2022  1316 Patient is feeling much better and has been able to tolerate a p.o. challenge that includes saltines, graham crackers, and ginger ale.  She is comfortable with the plan for discharge.  I will give her an additional 2 days of a low-dose of prednisone; a 3-day course of methylprednisolone 16 mg by mouth is roughly equivalent to 20 mg of prednisone, so I will prescribe her 20 mg of prednisone for 3 days and a short taper after that.  She can follow-up with her CMN.  I gave my usual and customary return precautions. [CF]    Clinical Course User Index [CF] Loleta Rose, MD     FINAL CLINICAL IMPRESSION(S) / ED DIAGNOSES   Final diagnoses:  Hyperemesis gravidarum     Rx / DC Orders   ED Discharge Orders          Ordered    predniSONE (DELTASONE) 10 MG tablet        08/16/22 1320             Note:  This document was prepared using Dragon voice recognition software and may include  unintentional dictation errors.   Loleta Rose, MD 08/16/22 [phone]

## 2022-09-08 LAB — OB RESULTS CONSOLE VARICELLA ZOSTER ANTIBODY, IGG: Varicella: IMMUNE

## 2022-09-08 LAB — OB RESULTS CONSOLE RUBELLA ANTIBODY, IGM: Rubella: IMMUNE

## 2022-09-09 LAB — OB RESULTS CONSOLE HEPATITIS B SURFACE ANTIGEN: Hepatitis B Surface Ag: NEGATIVE

## 2022-12-30 LAB — OB RESULTS CONSOLE VARICELLA ZOSTER ANTIBODY, IGG: Varicella: IMMUNE

## 2022-12-30 LAB — OB RESULTS CONSOLE RUBELLA ANTIBODY, IGM: Rubella: IMMUNE

## 2023-01-01 LAB — OB RESULTS CONSOLE HIV ANTIBODY (ROUTINE TESTING): HIV: NONREACTIVE

## 2023-01-12 ENCOUNTER — Other Ambulatory Visit: Payer: Self-pay | Admitting: Obstetrics

## 2023-01-12 DIAGNOSIS — D509 Iron deficiency anemia, unspecified: Secondary | ICD-10-CM

## 2023-01-12 NOTE — Progress Notes (Signed)
 Patient is Hgb 9.7 @ 29.[redacted] weeks gestation. I have ordered iron transfusion weekly x 4 Chari Manning CNM

## 2023-01-13 NOTE — L&D Delivery Note (Signed)
 Delivery Note  Gabriela Davidson is a G2P1001 at [redacted]w[redacted]d, Patient's last menstrual period was 06/20/2022., consistent with [place] at [redacted]w[redacted]d. Estimated Date of Delivery: 03/27/23   First Stage: Labor onset: 2230 Augmentation:  None Analgesia Gabriela Davidson intrapartum: Epidural SROM at 2359 for meconium stained amniotic fluid  GBS: negative  Second Stage: Complete dilation at 0311 Onset of pushing at 0311 FHR second stage 125 bpm with moderate variability, variable and late decels with pushing  Prolong deceleration prior to onset of pushing. Spontaneous return to baseline after fluid bolus and maternal repositioning.  Initial pushing in semi-fowlers with late decelerations. Repositioned to right lateral for pushing. Late decelerations resolved after position change. FHR baseline maintained at 125bpm with moderate variability and intermittent variables.  Gabriela Davidson presented to L&D with active labor. She was expectantly managed. She initially progressed from 5 cm to 8 cm rapidly and then slower progression from 8 cm to C/C/-1. Gabriela Davidson did not have an urge to push.  Anterior lip was present after prolong deceleration. Gabriela Davidson was instructed to push with the next contraction and was then completely dilated. She continued to push with excellent maternal effort over approximately 15 minutes for a spontaneous vaginal birth.  Delivery of a viable baby girl on 03/22/2023 at 0326 by CNM Delivery of fetal head in OA position with initial restitution to ROT and then LOT No nuchal cord;  Anterior then posterior shoulders delivered easily with gentle downward traction. Baby placed on mom's chest, and attended to by baby RN Cord double clamped after cessation of pulsation, cut by father of baby  Cord blood sample collection: Not Indicated B POS  Third Stage: Oxytocin bolus started after delivery of infant for hemorrhage prophylaxis  Placenta delivered via Tomasa Blase mechanism intact with 3 VC @ [phone] Placenta disposition:  discarded  Uterine tone firm / bleeding initially brisk from laceration  -Fundus remained firm with small lochia noted  -TXA given d/t brisk bleeding from laceration   Laceration: 2nd degree vaginal Anesthesia for repair: Epidural Suture: 2.0 chromic CT Est. Blood Loss (mL): 350 ml   Complications: None  Mom to postpartum.  Baby to Couplet care / Skin to Skin.  Newborn: Information for the patient's newborn:  Derionna, Salvador [[id number]]  Live born female  Birth Weight: pending  APGAR: 9, 9  Newborn Delivery   Birth date/time: 03/22/2023 03:26:00 Delivery type: Vaginal, Spontaneous      Feeding planned: breast and formula feeding  ---------- Margaretmary Eddy, CNM Certified Nurse Midwife [place]  [facility]

## 2023-01-19 MED ORDER — IRON SUCROSE 300 MG IVPB - SIMPLE MED
300.0000 mg | Status: DC
Start: 2023-01-19 — End: 2023-02-16

## 2023-02-22 ENCOUNTER — Observation Stay
Admission: EM | Admit: 2023-02-22 | Discharge: 2023-02-22 | Disposition: A | Discharge status: Home / Self Care | Payer: BC Managed Care – PPO | Attending: Obstetrics and Gynecology | Admitting: Obstetrics and Gynecology

## 2023-02-22 ENCOUNTER — Other Ambulatory Visit: Payer: Self-pay

## 2023-02-22 ENCOUNTER — Encounter: Payer: Self-pay | Admitting: Obstetrics and Gynecology

## 2023-02-22 DIAGNOSIS — O368330 Maternal care for abnormalities of the fetal heart rate or rhythm, third trimester, not applicable or unspecified: Principal | ICD-10-CM | POA: Insufficient documentation

## 2023-02-22 DIAGNOSIS — O09523 Supervision of elderly multigravida, third trimester: Secondary | ICD-10-CM | POA: Insufficient documentation

## 2023-02-22 DIAGNOSIS — Z8759 Personal history of other complications of pregnancy, childbirth and the puerperium: Secondary | ICD-10-CM | POA: Insufficient documentation

## 2023-02-22 DIAGNOSIS — O368190 Decreased fetal movements, unspecified trimester, not applicable or unspecified: Principal | ICD-10-CM | POA: Diagnosis present

## 2023-02-22 DIAGNOSIS — Z3A35 35 weeks gestation of pregnancy: Secondary | ICD-10-CM | POA: Diagnosis not present

## 2023-02-22 NOTE — Discharge Summary (Signed)
Patient ID: GISSELE NARDUCCI MRN: [id number] DOB/AGE: [birth year] 28 y.o.  Admit date: 02/22/2023 Discharge date: 02/22/2023  Admission Diagnoses: 28yo G2P1 at [redacted]w[redacted]d was sent over from the office after reporting DFM. Denies VB, LOF, or UCs  Discharge Diagnoses: RNST  Factors complicating pregnancy: History of preeclampsia w/o SF Elevated BP reading 11/04/22 in office Anemia   Prenatal Procedures: NST  Consults: None  Significant Diagnostic Studies:  No results found for this or any previous visit (from the past week).  Treatments: none  Hospital Course:  This is a 28 y.o. G2P1001 with IUP at [redacted]w[redacted]d seen for DFM.  She was observed, fetal heart rate monitoring remained reassuring with a RNST, and she had no signs/symptoms of preterm labor or other maternal-fetal concerns.  She was deemed stable for discharge to home with outpatient follow up.  Discharge Physical Exam:  BP 117/80 (BP Location: Left Arm)   Pulse (!) 110   Temp 98.3 F (36.8 C) (Oral)   Resp 16   LMP 06/20/2022   General: NAD CV: RRR Pulm: CTABL, nl effort ABD: s/nd/nt, gravid DVT Evaluation: LE non-ttp, no evidence of DVT on exam.  NST: FHR baseline: 125 bpm Variability: moderate Accelerations: yes Decelerations: none Category/reactivity: reactive  TOCO: occasioal - felt as tightening SVE: deferred      Discharge Condition: Stable  Disposition:  Discharge disposition: 01-Home or Self Care        Allergies as of 02/22/2023   No Known Allergies      Medication List     TAKE these medications    desloratadine 5 MG tablet Commonly known as: CLARINEX Take 5 mg by mouth daily.   doxylamine (Sleep) 25 MG tablet Commonly known as: UNISOM Take 25 mg by mouth at bedtime as needed for sleep.   EPINEPHrine 0.3 mg/0.3 mL Soaj injection Commonly known as: EPI-PEN See admin instructions.   ondansetron 4 MG disintegrating tablet Commonly known as: ZOFRAN-ODT Take 1 tablet (4 mg total) by  mouth every 8 (eight) hours as needed for nausea or vomiting.   predniSONE 10 MG tablet Commonly known as: DELTASONE Take 2 tabs (20 mg) PO x 3 days, then take 1 tab (10 mg) PO x 3 days, then take 1/2 tab (5 mg) PO x 4 days.        Follow-up Information     [facility] Follow up.   Why: Keep all scheduled appointments Contact information: [address] [place] [address] [phone]                Signed:  Quillian Quince 02/22/2023 9:07 PM

## 2023-02-22 NOTE — OB Triage Note (Signed)
 28 y.o G2P1 presents to L&D triage at [redacted]w[redacted]d for decreased fetal movement. She was sent over from the clinic today after her prenatal appt due to reporting decreased fetal movement over the past 2 days. She denies vaginal bleeding, ctx's, and other symptoms other than pelvic pain which she currently is not experiencing. Monitors applied and assessing, vitals wnl, [place] CNM aware of pt arrival. POC includes 1 hour of fetal monitoring.

## 2023-02-22 NOTE — OB Triage Note (Signed)
 Pt being discharged home after 1 hour of Category 1 strip. Discharge instructions reviewed and pt verbalized understanding. Pt told to call office if she continues to feel decreased fetal movement and to return to the hospital for vaginal bleeding, lof, ctx's. Pt leaving with partner, stable and ambulatory.

## 2023-03-02 LAB — OB RESULTS CONSOLE GC/CHLAMYDIA
Chlamydia: NEGATIVE
Chlamydia: NEGATIVE
Neisseria Gonorrhea: NEGATIVE
Neisseria Gonorrhea: NEGATIVE

## 2023-03-02 LAB — OB RESULTS CONSOLE GBS: GBS: NEGATIVE

## 2023-03-19 ENCOUNTER — Observation Stay
Admission: EM | Admit: 2023-03-19 | Discharge: 2023-03-19 | Disposition: A | Discharge status: Home / Self Care | Source: Home / Self Care | Admitting: Obstetrics and Gynecology

## 2023-03-19 ENCOUNTER — Encounter: Payer: Self-pay | Admitting: Obstetrics and Gynecology

## 2023-03-19 ENCOUNTER — Other Ambulatory Visit: Payer: Self-pay

## 2023-03-19 DIAGNOSIS — Z3A38 38 weeks gestation of pregnancy: Secondary | ICD-10-CM | POA: Insufficient documentation

## 2023-03-19 DIAGNOSIS — O26893 Other specified pregnancy related conditions, third trimester: Principal | ICD-10-CM | POA: Insufficient documentation

## 2023-03-19 DIAGNOSIS — R102 Pelvic and perineal pain: Secondary | ICD-10-CM | POA: Insufficient documentation

## 2023-03-19 DIAGNOSIS — O26899 Other specified pregnancy related conditions, unspecified trimester: Principal | ICD-10-CM | POA: Diagnosis present

## 2023-03-19 DIAGNOSIS — Z79899 Other long term (current) drug therapy: Secondary | ICD-10-CM | POA: Insufficient documentation

## 2023-03-19 DIAGNOSIS — Z8673 Personal history of transient ischemic attack (TIA), and cerebral infarction without residual deficits: Secondary | ICD-10-CM | POA: Insufficient documentation

## 2023-03-19 NOTE — OB Triage Note (Signed)
 Patient is Gabriela Davidson [redacted]w[redacted]d is seen at [facility] . Was seen last on Monday in office. Pt has hx PreE ;was never on magnesium. Patient today c/o mucous discharge and pelvic and vaginal pressure. Patient denies LOF, denies headache and no blurry vision and no epigastric pain. VSS. Provider Lib[place] CNM notified,.

## 2023-03-19 NOTE — Discharge Summary (Addendum)
 Gabriela Davidson is a 28 y.o. female. She is at [redacted]w[redacted]d gestation. Patient's last menstrual period was 06/20/2022. Estimated Date of Delivery: 03/27/23  Prenatal care site: [facility]  Chief complaint: Pelvic Pressure and loss of Mucous Plug   HPI: Gabriela Davidson presents to L&D with concerns of losing mucous plug and pelvic pressure. Denies consistent painful contractions. She states she has been experiencing [employer] contractions. Reports occasional upper abdomen cramping. Denies vaginal bleeding and leakage of fluid. Reports active fetal movement. She has an IOL scheduled for 03/24/2023 @ 0800.   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 No contractions, no vaginal bleeding, and no leakage of fluid. Endorses active fetal movement.   Maternal Medical History:  Past Medical Hx:  has a past medical history of Acne, Acute ischemic stroke (HCC), Allergic rhinitis, Gestational hypertension, third trimester (12/28/2018), and Pre-eclampsia.    Past Surgical Hx:  has a past surgical history that includes No past surgeries.   No Known Allergies   Prior to Admission medications   Medication Sig Start Date End Date Taking? Authorizing Provider  desloratadine (CLARINEX) 5 MG tablet Take 5 mg by mouth daily.    [provider]  doxylamine, Sleep, (UNISOM) 25 MG tablet Take 25 mg by mouth at bedtime as needed for sleep.    [provider]  EPINEPHrine 0.3 mg/0.3 mL IJ SOAJ injection See admin instructions.    [provider]  ondansetron (ZOFRAN-ODT) 4 MG disintegrating tablet Take 1 tablet (4 mg total) by mouth every 8 (eight) hours as needed for nausea or vomiting. 08/06/22   Carlynn Herald, CNM  predniSONE (DELTASONE) 10 MG tablet Take 2 tabs (20 mg) PO x 3 days, then take 1 tab (10 mg) PO x 3 days, then take 1/2 tab (5 mg) PO x 4 days. 08/16/22   Loleta Rose, MD    Social History: She  reports that she has  never smoked. She has never used smokeless tobacco. She reports that she does not currently use alcohol. She reports that she does not use drugs.  Family History: family history includes Cancer in her maternal grandmother; Healthy in her mother; Hypertension in her father. No history of gyn cancers.  Review of Systems:  Review of Systems  Constitutional: Negative.   Eyes:  Negative for blurred vision and double vision.  Respiratory: Negative.    Cardiovascular: Negative.   Gastrointestinal:  Positive for abdominal pain (occasional mild upper abdominal cramping).  Genitourinary: Negative.   Neurological: Negative.   Psychiatric/Behavioral: Negative.      O:  BP 125/76   Pulse 81   Temp 98.5 F (36.9 C) (Oral)   LMP 06/20/2022  No results found for this or any previous visit (from the past 48 hours).   Vitals:   03/19/23 1940 03/19/23 1955  BP: 132/85 125/76     Constitutional: NAD, AAOx3  HE/ENT: extraocular movements grossly intact, moist mucous membranes CV: RRR PULM: nl respiratory effort, CTABL Abd: gravid, non-tender, non-distended, soft  Ext: Non-tender, Nonedmeatous Psych: mood appropriate, speech normal Pelvic : Deferred  SVE: Dilation: 2.5 Effacement (%): 50 Station: -2 Exam by:: Katie A Rn   NST: Baseline: 130 bpm Variability: moderate Accels: Present Decels: none Toco: irregular, 2.5-8 ,minutes Category: I INDICATIONS: patient reassurance RESULTS:  A NST procedure was performed with FHR monitoring and a normal baseline established, appropriate time of 20-40 minutes of evaluation, and accels >2 seen w 15x15 characteristics.  Results  show a REACTIVE NST.   Assessment: 28 y.o. [redacted]w[redacted]d here for antenatal surveillance during pregnancy.  Principle diagnosis: Pelvic pressure and loss of mucous plug   Plan: Antenatal surveillance  Labor: Not present. [place] and toco placed on patient abdomen.  Patient reports feeling occasional mild cramping in upper abdomen and  not strong consistent contractions. Abdomen soft and nontender to palpation. FHR tracing observed for approximately 1 hour and SVE: 2/50/-2 unchanged after 1 hour reassuring against active labor.  Fetal Wellbeing: Reassuring Cat 1 tracing. NST reactive Encourage increase fluid intake.  Patient reassured that losing a mucous plug is a normal part of pregnancy in the third trimester.  Education about labor precautions discussed. Education of signs/symptoms of labor provided in AVS.   2. Pelvic Pressure  Tylenol 1,000 mg PO offered. Patient declined. Encourage maternal repositioning and reassured that it pelvic pressure is a common symptom to experience in the late third trimester.   - D/c home stable, strict return precautions reviewed, follow-up as needed.   ----- Roney Jaffe, CNM Certified Nurse Midwife [place]  [facility]

## 2023-03-19 NOTE — OB Triage Note (Signed)
 Patient d/c'ed with paperwork and education. Teachback method pt understanding time for induction next week.

## 2023-03-21 ENCOUNTER — Inpatient Hospital Stay
Admission: EM | Admit: 2023-03-21 | Discharge: 2023-03-23 | DRG: 807 | Disposition: A | Discharge status: Home / Self Care | Attending: Obstetrics and Gynecology | Admitting: Obstetrics and Gynecology

## 2023-03-21 ENCOUNTER — Encounter: Payer: Self-pay | Admitting: Obstetrics and Gynecology

## 2023-03-21 DIAGNOSIS — Z3A39 39 weeks gestation of pregnancy: Secondary | ICD-10-CM

## 2023-03-21 DIAGNOSIS — O9902 Anemia complicating childbirth: Principal | ICD-10-CM | POA: Diagnosis present

## 2023-03-21 DIAGNOSIS — K219 Gastro-esophageal reflux disease without esophagitis: Secondary | ICD-10-CM | POA: Diagnosis present

## 2023-03-21 DIAGNOSIS — O9962 Diseases of the digestive system complicating childbirth: Secondary | ICD-10-CM | POA: Diagnosis present

## 2023-03-21 DIAGNOSIS — D509 Iron deficiency anemia, unspecified: Secondary | ICD-10-CM | POA: Diagnosis present

## 2023-03-21 DIAGNOSIS — Z8759 Personal history of other complications of pregnancy, childbirth and the puerperium: Secondary | ICD-10-CM

## 2023-03-21 DIAGNOSIS — O99019 Anemia complicating pregnancy, unspecified trimester: Secondary | ICD-10-CM | POA: Diagnosis present

## 2023-03-21 DIAGNOSIS — Z8249 Family history of ischemic heart disease and other diseases of the circulatory system: Secondary | ICD-10-CM

## 2023-03-21 DIAGNOSIS — O26893 Other specified pregnancy related conditions, third trimester: Secondary | ICD-10-CM | POA: Diagnosis present

## 2023-03-21 LAB — CBC
HCT: 34.3 % — ABNORMAL LOW (ref 36.0–46.0)
Hemoglobin: 11.7 g/dL — ABNORMAL LOW (ref 12.0–15.0)
MCH: 27.4 pg (ref 26.0–34.0)
MCHC: 34.1 g/dL (ref 30.0–36.0)
MCV: 80.3 fL (ref 80.0–100.0)
Platelets: 298 10*3/uL (ref 150–400)
RBC: 4.27 MIL/uL (ref 3.87–5.11)
RDW: 14.3 % (ref 11.5–15.5)
WBC: 10.9 10*3/uL — ABNORMAL HIGH (ref 4.0–10.5)
nRBC: 0 % (ref 0.0–0.2)

## 2023-03-21 MED ORDER — LACTATED RINGERS IV SOLN: INTRAVENOUS | Status: DC

## 2023-03-21 MED ORDER — FENTANYL-BUPIVACAINE-NACL 0.5-0.125-0.9 MG/250ML-% EP SOLN
EPIDURAL | Status: AC
  Filled 2023-03-21: qty 250

## 2023-03-21 MED ORDER — SODIUM CHLORIDE 0.9% FLUSH: 3.0000 mL | Freq: Two times a day (BID) | INTRAVENOUS | Status: DC

## 2023-03-21 MED ORDER — SOD CITRATE-CITRIC ACID 500-334 MG/5ML PO SOLN: 30.0000 mL | ORAL | Status: DC | PRN

## 2023-03-21 MED ORDER — LIDOCAINE HCL (PF) 1 % IJ SOLN
INTRAMUSCULAR | Status: AC
  Filled 2023-03-21: qty 30

## 2023-03-21 MED ORDER — OXYTOCIN BOLUS FROM INFUSION
333.0000 mL | Freq: Once | INTRAVENOUS | Status: AC
  Administered 2023-03-22: 333 mL via INTRAVENOUS

## 2023-03-21 MED ORDER — ONDANSETRON HCL 4 MG/2ML IJ SOLN
4.0000 mg | Freq: Four times a day (QID) | INTRAMUSCULAR | Status: DC | PRN
Start: 2023-03-21 — End: 2023-03-22

## 2023-03-21 MED ORDER — SODIUM CHLORIDE 0.9% FLUSH: 3.0000 mL | INTRAVENOUS | Status: DC | PRN

## 2023-03-21 MED ORDER — OXYTOCIN 10 UNIT/ML IJ SOLN
INTRAMUSCULAR | Status: DC
Start: 2023-03-21 — End: 2023-03-22
  Filled 2023-03-21: qty 2

## 2023-03-21 MED ORDER — LACTATED RINGERS IV SOLN: 500.0000 mL | INTRAVENOUS | Status: DC | PRN

## 2023-03-21 MED ORDER — OXYTOCIN-SODIUM CHLORIDE 30-0.9 UT/500ML-% IV SOLN
2.5000 [IU]/h | INTRAVENOUS | Status: DC
  Administered 2023-03-22: 2.5 [IU]/h via INTRAVENOUS
  Filled 2023-03-21: qty 500

## 2023-03-21 MED ORDER — LIDOCAINE HCL (PF) 1 % IJ SOLN: 30.0000 mL | INTRAMUSCULAR | Status: DC | PRN

## 2023-03-21 MED ORDER — SODIUM CHLORIDE 0.9 % IV SOLN: 250.0000 mL | INTRAVENOUS | Status: DC | PRN

## 2023-03-21 MED ORDER — MISOPROSTOL 200 MCG PO TABS
ORAL_TABLET | ORAL | Status: AC
  Filled 2023-03-21: qty 4

## 2023-03-21 MED ORDER — ACETAMINOPHEN 500 MG PO TABS: 1000.0000 mg | ORAL_TABLET | Freq: Four times a day (QID) | ORAL | Status: DC | PRN

## 2023-03-21 MED ORDER — AMMONIA AROMATIC IN INHA
RESPIRATORY_TRACT | Status: DC
Start: 2023-03-21 — End: 2023-03-22
  Filled 2023-03-21: qty 10

## 2023-03-21 MED ORDER — FENTANYL CITRATE (PF) 100 MCG/2ML IJ SOLN: 50.0000 ug | INTRAMUSCULAR | Status: DC | PRN

## 2023-03-21 NOTE — H&P (Signed)
 OB History & Physical   History of Present Illness:   Chief Complaint: contractions   HPI:  Gabriela Davidson is a 28 y.o. G14P1001 female at [redacted]w[redacted]d, Patient's last menstrual period was 06/20/2022., consistent with [place] at [redacted]w[redacted]d, with Estimated Date of Delivery: 03/27/23.  She presents to L&D for contractions.  Her pregnancy is uncomplicated.  She reports contractions started earlier this evening and have gotten progressively stronger. She denies Loss of fluid or Vaginal bleeding. Endorses fetal movement as active.   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   Acute left-sided weakness 12/16/2019    Prenatal Transfer Tool  Maternal Diabetes: No Genetic Screening: Normal Maternal Ultrasounds/Referrals: Normal Fetal Ultrasounds or other Referrals:  None Maternal Substance Abuse:  No Significant Maternal Medications:  Meds include: Other: Valtrex  Significant Maternal Lab Results: Group B Strep negative and Other: HSV1 positive   Maternal Medical History:   Past Medical History:  Diagnosis Date   Acne    Takes spironolactone for acne   Acute ischemic stroke (HCC)    2021- never proven   Allergic rhinitis    Gestational hypertension, third trimester 12/28/2018   Pre-eclampsia     Past Surgical History:  Procedure Laterality Date   NO PAST SURGERIES      No Known Allergies  Prior to Admission medications   Medication Sig Start Date End Date Taking? Authorizing Provider  ondansetron (ZOFRAN-ODT) 4 MG disintegrating tablet  Take 1 tablet (4 mg total) by mouth every 8 (eight) hours as needed for nausea or vomiting. 08/06/22  Yes Sandra Cockayne M, CNM  desloratadine (CLARINEX) 5 MG tablet Take 5 mg by mouth daily. Patient not taking: Reported on 03/21/2023    [provider]  doxylamine, Sleep, (UNISOM) 25 MG tablet Take 25 mg by mouth at bedtime as needed for sleep. Patient not taking: Reported on 03/21/2023    [provider]  EPINEPHrine 0.3 mg/0.3 mL IJ SOAJ injection See admin instructions. Patient not taking: Reported on 03/21/2023    [provider]  predniSONE (DELTASONE) 10 MG tablet Take 2 tabs (20 mg) PO x 3 days, then take 1 tab (10 mg) PO x 3 days, then take 1/2 tab (5 mg) PO x 4 days. Patient not taking: Reported on 03/21/2023 08/16/22   Loleta Rose, MD    OB History  Gravida Para Term Preterm AB Living  2 1 1  0 0 1  SAB IAB Ectopic Multiple Live Births  0 0 0 0 1    # Outcome Date GA Lbr Len/2nd Weight Sex Type Anes PTL Lv  2 Current           1 Term 12/29/18 [redacted]w[redacted]d / 00:41 2520 g F Vag-Spont EPI  LIV     Birth Comments: N/A     Name: Gabriela Davidson     Apgar1: 9  Apgar5: 9     Social History: She  reports that she has never smoked. She has never used smokeless tobacco. She reports that she does not currently use alcohol. She reports that  she does not use drugs.  Family History: family history includes Cancer in her maternal grandmother; Healthy in her mother; Hypertension in her father.   Review of Systems: A full review of systems was performed and negative except as noted in the HPI.     Physical Exam:  Vital Signs: LMP 06/20/2022   General: no acute distress.  HEENT: normocephalic, atraumatic Heart: regular rate & rhythm Lungs: normal respiratory effort Abdomen: soft, gravid, non-tender Pelvic:   External: Normal external female genitalia  Cervix 5/80/-2 by RN  Extremities: non-tender, symmetric, no edema bilaterally.  DTRs: 2+/2+  Neurologic: Alert &  oriented x 3.    No results found for this or any previous visit (from the past 24 hours).  Pertinent Results:  Prenatal Labs: Blood type/Rh B POS Performed at [facility], [address] [place], [place] [address]    Antibody screen Negative    Rubella Immune    Varicella Immune  RPR NR    HBsAg Neg   Hep C NR   HIV Neg    GC neg  Chlamydia neg  Genetic screening cfDNA negative/AFP neg  1 hour GTT 108  3 hour GTT N/A  GBS Neg     FHT:  {findings; monitor fetal heart monitor:21620:::0} Category/reactivity:  {CHL FWB PLAN:21624:::0} UC:   {obgyn contractions reg/irreg:312982:::0}   Cephalic by Leopolds and ***SVE   No results found.  Assessment:  Gabriela Davidson is a 28 y.o. G25P1001 female at [redacted]w[redacted]d with normal labor.   Plan:  1. Admit to Labor & Delivery - Admission status: Inpatient - Dr. Lavina Hamman MD notified of admission and plan of care  - Reason for admission: labor management - consents reviewed and obtained  2. Fetal Well being  - Fetal Tracing: *** - Group B Streptococcus ppx not indicated: GBS negative - Presentation: cephalic confirmed by SVE   3. Routine OB: - Prenatal labs reviewed, as above - Rh positive - CBC, T&S, RPR on admit - Clear liquid diet , continuous IV fluids  4. Monitoring of labor  - Contractions monitored with external toco - Pelvis proven to 2550 grams, adequate for trial of labor  - Plan for expectant management  - Augmentation with AROM as appropriate  - Plan for  continuous fetal monitoring - Maternal pain control as desired; planning regional anesthesia - Anticipate vaginal delivery  5. Post Partum Planning: - Infant feeding: breast feeding - Contraception: vasectomy - Flu vaccine:  declined  - Tdap vaccine:  declined  - RSV vaccine: Given during pregnancy >/=14 days ago on 02/04/2023  Gustavo Lah, Ina Homes 03/21/23 11:32 PM  Margaretmary Eddy, CNM Certified Nurse Midwife [facility]  [facility]

## 2023-03-22 ENCOUNTER — Other Ambulatory Visit: Payer: Self-pay

## 2023-03-22 ENCOUNTER — Inpatient Hospital Stay: Admitting: Anesthesiology

## 2023-03-22 ENCOUNTER — Encounter: Payer: Self-pay | Admitting: Obstetrics and Gynecology

## 2023-03-22 DIAGNOSIS — O99019 Anemia complicating pregnancy, unspecified trimester: Secondary | ICD-10-CM | POA: Diagnosis present

## 2023-03-22 LAB — TYPE AND SCREEN

## 2023-03-22 LAB — CBC
HCT: 29.1 % — ABNORMAL LOW (ref 36.0–46.0)
Hemoglobin: 9.7 g/dL — ABNORMAL LOW (ref 12.0–15.0)
MCH: 27.3 pg (ref 26.0–34.0)
MCHC: 33.3 g/dL (ref 30.0–36.0)
MCV: 82 fL (ref 80.0–100.0)
Platelets: 237 10*3/uL (ref 150–400)
RBC: 3.55 MIL/uL — ABNORMAL LOW (ref 3.87–5.11)
RDW: 14.4 % (ref 11.5–15.5)
WBC: 15.8 10*3/uL — ABNORMAL HIGH (ref 4.0–10.5)
nRBC: 0 % (ref 0.0–0.2)

## 2023-03-22 LAB — RPR: RPR Ser Ql: NONREACTIVE

## 2023-03-22 MED ORDER — DIPHENHYDRAMINE HCL 25 MG PO CAPS: 25.0000 mg | ORAL_CAPSULE | Freq: Four times a day (QID) | ORAL | Status: DC | PRN

## 2023-03-22 MED ORDER — PHENYLEPHRINE 80 MCG/ML (10ML) SYRINGE FOR IV PUSH (FOR BLOOD PRESSURE SUPPORT): 80.0000 ug | PREFILLED_SYRINGE | INTRAVENOUS | Status: DC | PRN

## 2023-03-22 MED ORDER — PRENATAL MULTIVITAMIN CH
1.0000 | ORAL_TABLET | Freq: Every day | ORAL | Status: DC
  Administered 2023-03-22 – 2023-03-23 (×2): 1 via ORAL
  Filled 2023-03-22 (×2): qty 1

## 2023-03-22 MED ORDER — DIBUCAINE (PERIANAL) 1 % EX OINT
1.0000 | TOPICAL_OINTMENT | CUTANEOUS | Status: DC | PRN
  Administered 2023-03-22: 1 via RECTAL
  Filled 2023-03-22: qty 28

## 2023-03-22 MED ORDER — SIMETHICONE 80 MG PO CHEW: 80.0000 mg | CHEWABLE_TABLET | ORAL | Status: DC | PRN

## 2023-03-22 MED ORDER — SENNOSIDES-DOCUSATE SODIUM 8.6-50 MG PO TABS
2.0000 | ORAL_TABLET | Freq: Every day | ORAL | Status: DC
  Administered 2023-03-23: 2 via ORAL
  Filled 2023-03-22: qty 2

## 2023-03-22 MED ORDER — COCONUT OIL OIL
1.0000 | TOPICAL_OIL | Status: DC | PRN
  Administered 2023-03-22: 1 via TOPICAL
  Filled 2023-03-22: qty 7.5

## 2023-03-22 MED ORDER — BENZOCAINE-MENTHOL 20-0.5 % EX AERO
1.0000 | INHALATION_SPRAY | CUTANEOUS | Status: DC | PRN
  Administered 2023-03-22: 1 via TOPICAL
  Filled 2023-03-22: qty 56

## 2023-03-22 MED ORDER — TRANEXAMIC ACID-NACL 1000-0.7 MG/100ML-% IV SOLN
INTRAVENOUS | Status: AC
  Administered 2023-03-22: 1000 mg
  Filled 2023-03-22: qty 100

## 2023-03-22 MED ORDER — LIDOCAINE-EPINEPHRINE (PF) 1.5 %-1:200000 IJ SOLN
INTRAMUSCULAR | Status: DC | PRN
  Administered 2023-03-22: 3 mL via EPIDURAL
  Administered 2023-03-22: 2 mL via EPIDURAL

## 2023-03-22 MED ORDER — LIDOCAINE HCL (PF) 1 % IJ SOLN
INTRAMUSCULAR | Status: DC | PRN
  Administered 2023-03-22: 3 mL via SUBCUTANEOUS

## 2023-03-22 MED ORDER — LACTATED RINGERS IV SOLN: 500.0000 mL | Freq: Once | INTRAVENOUS | Status: DC

## 2023-03-22 MED ORDER — FENTANYL-BUPIVACAINE-NACL 0.5-0.125-0.9 MG/250ML-% EP SOLN
12.0000 mL/h | EPIDURAL | Status: DC | PRN
  Administered 2023-03-22: 12 mL/h via EPIDURAL

## 2023-03-22 MED ORDER — ACETAMINOPHEN 500 MG PO TABS
1000.0000 mg | ORAL_TABLET | Freq: Four times a day (QID) | ORAL | Status: DC
  Administered 2023-03-22 – 2023-03-23 (×4): 1000 mg via ORAL
  Filled 2023-03-22 (×5): qty 2

## 2023-03-22 MED ORDER — IRON SUCROSE 300 MG IVPB - SIMPLE MED
300.0000 mg | Freq: Once | Status: AC
Start: 2023-03-22 — End: 2023-03-22
  Administered 2023-03-22: 300 mg via INTRAVENOUS
  Filled 2023-03-22: qty 300

## 2023-03-22 MED ORDER — IBUPROFEN 600 MG PO TABS
600.0000 mg | ORAL_TABLET | Freq: Four times a day (QID) | ORAL | Status: DC
  Administered 2023-03-22 – 2023-03-23 (×5): 600 mg via ORAL
  Filled 2023-03-22 (×5): qty 1

## 2023-03-22 MED ORDER — EPHEDRINE 5 MG/ML INJ: 10.0000 mg | INTRAVENOUS | Status: DC | PRN

## 2023-03-22 MED ORDER — ZOLPIDEM TARTRATE 5 MG PO TABS: 5.0000 mg | ORAL_TABLET | Freq: Every evening | ORAL | Status: DC | PRN

## 2023-03-22 MED ORDER — FERROUS SULFATE 325 (65 FE) MG PO TABS
325.0000 mg | ORAL_TABLET | Freq: Two times a day (BID) | ORAL | Status: DC
  Administered 2023-03-22 – 2023-03-23 (×2): 325 mg via ORAL
  Filled 2023-03-22 (×3): qty 1

## 2023-03-22 MED ORDER — WITCH HAZEL-GLYCERIN EX PADS
1.0000 | MEDICATED_PAD | CUTANEOUS | Status: DC | PRN
  Administered 2023-03-22 – 2023-03-23 (×3): 1 via TOPICAL
  Filled 2023-03-22 (×3): qty 100

## 2023-03-22 MED ORDER — ONDANSETRON HCL 4 MG PO TABS: 4.0000 mg | ORAL_TABLET | ORAL | Status: DC | PRN

## 2023-03-22 MED ORDER — DIPHENHYDRAMINE HCL 50 MG/ML IJ SOLN: 12.5000 mg | INTRAMUSCULAR | Status: DC | PRN

## 2023-03-22 MED ORDER — SODIUM CHLORIDE 0.9 % IV SOLN
INTRAVENOUS | Status: DC | PRN
  Administered 2023-03-22 (×2): 5 mL via EPIDURAL

## 2023-03-22 MED ORDER — ONDANSETRON HCL 4 MG/2ML IJ SOLN: 4.0000 mg | INTRAMUSCULAR | Status: DC | PRN

## 2023-03-22 NOTE — OB Triage Note (Signed)
 Gabriela Davidson 28 y.o. G2P1 @[redacted]w[redacted]d   presents to Labor & Delivery triage via wheelchair steered by ED staff reporting ctx every 2.5 mins. She denies signs and symptoms consistent with rupture of membranes or active vaginal bleeding. She states positive fetal movement. External FM and TOCO applied to non-tender abdomen. SVE 5/80/-2. Patient oriented to care environment including call bell and bed control use. Tami Lin, CNM notified of patient's arrival. Plan to admit for active labor.

## 2023-03-22 NOTE — Discharge Summary (Signed)
 Postpartum Discharge Summary  Patient Name: Gabriela Davidson DOB: [birth year] MRN: [id number]  Date of admission: 03/21/2023 Delivery date:03/22/2023 Delivering provider: Gustavo Lah Date of discharge: 03/23/2023  Primary OB: [facility] [id number] last menstrual period was 06/20/2022. EDC Estimated Date of Delivery: 03/27/23 Gestational Age at Delivery: [redacted]w[redacted]d   Admitting diagnosis: Normal labor [O80, Z37.9] Intrauterine pregnancy: [redacted]w[redacted]d     Secondary diagnosis:   Principal Problem:   Normal labor Active Problems:   History of pre-eclampsia   NSVD (normal spontaneous vaginal delivery)   Obstetric vaginal laceration with second degree perineal laceration   Anemia affecting pregnancy   Discharge Diagnosis: Term Pregnancy Delivered      Hospital course: Onset of Labor With Vaginal Delivery      28 y.o. yo G2P1001 at [redacted]w[redacted]d was admitted in Active Labor on 03/21/2023. Labor course was uncomplicated.She was expectantly managed. She initially progressed from 5 cm to 8 cm rapidly and then slower progression from 8 cm to C/C/-1. Gabriela Davidson did not have an urge to push.  Anterior lip was present after prolong deceleration. Gabriela Davidson was instructed to push with the next contraction and was then completely dilated. She continued to push with excellent maternal effort over approximately 15 minutes for a spontaneous vaginal birth.  Membrane Rupture Time/Date: 11:59 PM,03/21/2023  Delivery Method:Vaginal, Spontaneous Operative Delivery:N/A Episiotomy: None Lacerations:  2nd degree;Vaginal Patient had an uncomplicated postpartum course. She is ambulating, tolerating a regular diet, passing flatus, and urinating well. Patient is discharged home in stable condition on 03/23/23.  Newborn Data: Birth date:03/22/2023 Birth time:3:26 AM Gender:Female Living status:Living Apgars:9 ,9  Weight:3360 g                                            Post partum procedures: None  Augmentation::   None Complications: None Delivery Type: spontaneous vaginal delivery Anesthesia: epidural anesthesia Placenta: spontaneous To Pathology: No   Prenatal Labs:  Blood type/Rh B POS Performed at [facility], [address] [place], [place] [address]    Antibody screen Negative    Rubella Immune    Varicella Immune  RPR NR    HBsAg Neg   Hep C NR   HIV Neg    GC neg  Chlamydia neg  Genetic screening cfDNA negative/AFP neg  1 hour GTT 108  3 hour GTT N/A  GBS Neg      Magnesium Sulfate received: No BMZ received: No Rhophylac:was not indicated MMR: was not indicated Varivax vaccine given: was not indicated - Tdap vaccine:  declined  - Flu vaccine:  declined  -RSV vaccine:RSV Given: Given during pregnancy >/=14 days ago on 02/04/2023  Transfusion:No  Physical exam  Vitals:   03/22/23 1452 03/22/23 1820 03/22/23 2340 03/23/23 0835  BP: 114/70 120/77 117/70 115/75  Pulse: 83 87 75 79  Resp:  18 16 18   Temp: 99 F (37.2 C) 98.6 F (37 C) 97.7 F (36.5 C) 97.8 F (36.6 C)  TempSrc: Oral Oral Oral Oral  SpO2: 100% 96% 100% 99%  Weight:      Height:       General: alert, cooperative, and no distress Lochia: appropriate Uterine Fundus: firm Perineum:minimal edema/repair well approximated DVT Evaluation: No evidence of DVT seen on physical exam.  Labs: Lab Results  Component Value Date   WBC 9.9 03/23/2023   HGB 8.8 (L) 03/23/2023   HCT  25.7 (L) 03/23/2023   MCV 79.6 (L) 03/23/2023   PLT 202 03/23/2023      Latest Ref Rng & Units 08/16/2022    9:46 AM  CMP  Glucose 70 - 99 mg/dL 409   BUN 6 - 20 mg/dL 10   Creatinine 8.11 - 1.00 mg/dL 9.14   Sodium 782 - 956 mmol/L 134   Potassium 3.5 - 5.1 mmol/L 3.4   Chloride 98 - 111 mmol/L 106   CO2 22 - 32 mmol/L 20   Calcium 8.9 - 10.3 mg/dL 9.2   Total Protein 6.5 - 8.1 g/dL 7.9   Total Bilirubin 0.3 - 1.2 mg/dL 1.3   Alkaline Phos 38 - 126 U/L 61   AST 15 - 41 U/L 57   ALT 0 - 44 U/L 84     Edinburgh Score:    03/23/2023    3:15 AM  Edinburgh Postnatal Depression Scale Screening Tool  I have been able to laugh and see the funny side of things. 0  I have looked forward with enjoyment to things. 0  I have blamed myself unnecessarily when things went wrong. 0  I have been anxious or worried for no good reason. 0  I have felt scared or panicky for no good reason. 0  Things have been getting on top of me. 0  I have been so unhappy that I have had difficulty sleeping. 0  I have felt sad or miserable. 0  I have been so unhappy that I have been crying. 0  The thought of harming myself has occurred to me. 0  Edinburgh Postnatal Depression Scale Total 0    Risk assessment for postpartum VTE and prophylactic treatment: Very high risk factors: None High risk factors: None Moderate risk factors: BMI 30-40 kg/m2  Postpartum VTE prophylaxis with LMWH not indicated  After visit meds:  Allergies as of 03/23/2023   No Known Allergies      Medication List     TAKE these medications    acetaminophen 325 MG suppository Commonly known as: TYLENOL Place 1 suppository (325 mg total) rectally every 4 (four) hours as needed.   benzocaine-Menthol 20-0.5 % Aero Commonly known as: DERMOPLAST Apply 1 Application topically as needed for irritation (perineal discomfort).   coconut oil Oil Apply 1 Application topically as needed.   dibucaine 1 % Oint Commonly known as: NUPERCAINAL Place 1 Application rectally as needed for hemorrhoids.   ferrous sulfate 325 (65 FE) MG tablet Take 1 tablet (325 mg total) by mouth daily.   ibuprofen 600 MG tablet Commonly known as: ADVIL Take 1 tablet (600 mg total) by mouth every 6 (six) hours as needed.   prenatal multivitamin Tabs tablet Take 1 tablet by mouth daily at 12 noon.   senna-docusate 8.6-50 MG tablet Commonly known as: Senokot-S Take 2 tablets by mouth daily. Start taking on: March 24, 2023   witch hazel-glycerin  pad Commonly known as: TUCKS Apply 1 Application topically as needed for hemorrhoids.       Discharge home in stable condition Infant Feeding: Breast Infant Disposition:home with mother Discharge instruction: per After Visit Summary and Postpartum booklet. Activity: Advance as tolerated. Pelvic rest for 6 weeks.  Diet: routine diet Anticipated Birth Control:  Contraceptives: Vasectomy Postpartum Appointment:6 weeks Additional Postpartum F/U:  None  Future Appointments:No future appointments. Follow up Visit:  Follow-up Information     Gustavo Lah, CNM. Schedule an appointment as soon as possible for a visit in 6 week(s).  Specialty: Certified Nurse Midwife Why: postpartum visit Contact information: [address] [place] [address] [phone]                 Plan:  Gabriela Davidson was discharged to home in good condition. - Patient advised to continue to take ferrous sulfate 325 mg PO daily to     increase iron levels.   - Hgb: 8.8 (03/23/2023)  - Asymptomatic today (03/23/2023)  - Plan to re-check hgb at 6wk pp visit.  - Follow-up appointment as directed.    Signed: Roney Jaffe, CNM

## 2023-03-22 NOTE — Progress Notes (Signed)
 L&D Note    Subjective:  Comfortable now epidural  Objective:   Vitals:   03/22/23 0115 03/22/23 0126 03/22/23 0128 03/22/23 0131  BP: 123/76  127/82   Pulse: 78  73   Resp:      Temp:      TempSrc:      SpO2:  99%  98%  Weight:      Height:        Current Vital Signs 24h Vital Sign Ranges  T 98.2 F (36.8 C) Temp  Avg: 98.2 F (36.8 C)  Min: 98.2 F (36.8 C)  Max: 98.2 F (36.8 C)  BP 127/82 BP  Min: 123/76  Max: 136/86  HR 73 Pulse  Avg: 78.8  Min: 64  Max: 93  RR 16 Resp  Avg: 16  Min: 16  Max: 16  SaO2 98 %   SpO2  Avg: 97.9 %  Min: 95 %  Max: 99 %      Gen: alert, cooperative, no distress FHR: Baseline: 135 bpm, Variability: moderate, Accels: Present, Decels: variable and late following epidural Toco: regular, every 2-3 minutes SVE: Dilation: 9 Effacement (%): 100 Cervical Position: Middle Station: -2 Presentation: Vertex Exam by:: Tami Lin, CNM  Medications SCHEDULED MEDICATIONS   ammonia       lidocaine (PF)       misoprostol       oxytocin       oxytocin 40 units in LR 1000 mL  333 mL Intravenous Once   sodium chloride flush  3 mL Intravenous Q12H    MEDICATION INFUSIONS   sodium chloride     fentaNYL 2 mcg/mL w/bupivacaine 0.125% in NS 250 mL 12 mL/hr (03/22/23 0044)   lactated ringers     lactated ringers     lactated ringers 125 mL/hr at 03/21/23 2335   oxytocin      PRN MEDICATIONS  sodium chloride, acetaminophen, ammonia, diphenhydrAMINE, ePHEDrine, ePHEDrine, fentaNYL (SUBLIMAZE) injection, fentaNYL 2 mcg/mL w/bupivacaine 0.125% in NS 250 mL, lactated ringers, lidocaine (PF), lidocaine (PF), misoprostol, ondansetron, oxytocin, phenylephrine, phenylephrine, sodium chloride flush, sodium citrate-citric acid   Assessment & Plan:  28 y.o. G2P1001 at [redacted]w[redacted]d admitted for normal labor  -Labor: Active phase labor. -Fetal Well-being: Category II -> Category I. Overall reassuring with moderate variability. Late and variable noted after epidural. Resolved  spontaneously.  -GBS: negative -Membranes ruptured, clear fluid, spontaneously at 2359 -Expectant management. -Analgesia: regional anesthesia   Gustavo Lah, CNM  03/22/2023 1:41 AM  [facility]

## 2023-03-22 NOTE — Anesthesia Preprocedure Evaluation (Signed)
 Anesthesia Evaluation  Patient identified by MRN, date of birth, ID band Patient awake    Reviewed: Allergy & Precautions, H&P , NPO status , Patient's Chart, lab work & pertinent test results  Airway Mallampati: II  TM Distance: >3 FB Neck ROM: full    Dental no notable dental hx. (+) Dental Advidsory Given   Pulmonary neg pulmonary ROS          Cardiovascular Exercise Tolerance: Good negative cardio ROS      Neuro/Psych  Headaches  negative psych ROS   GI/Hepatic Neg liver ROS,GERD  ,,  Endo/Other  negative endocrine ROS    Renal/GU negative Renal ROS  negative genitourinary   Musculoskeletal   Abdominal   Peds  Hematology negative hematology ROS (+)   Anesthesia Other Findings Past Medical History: No date: Acne     Comment:  Takes spironolactone for acne No date: Acute ischemic stroke [facility])     Comment:  2021- never proven No date: Allergic rhinitis 12/28/2018: Gestational hypertension, third trimester No date: Pre-eclampsia   Reproductive/Obstetrics (+) Pregnancy                             Anesthesia Physical Anesthesia Plan  ASA: 2  Anesthesia Plan: Epidural   Post-op Pain Management:    Induction:   PONV Risk Score and Plan:   Airway Management Planned:   Additional Equipment:   Intra-op Plan:   Post-operative Plan:   Informed Consent: I have reviewed the patients History and Physical, chart, labs and discussed the procedure including the risks, benefits and alternatives for the proposed anesthesia with the patient or authorized representative who has indicated his/her understanding and acceptance.       Plan Discussed with: CRNA  Anesthesia Plan Comments:         Anesthesia Quick Evaluation

## 2023-03-22 NOTE — Anesthesia Procedure Notes (Signed)
 Epidural Patient location during procedure: OB Start time: 03/22/2023 12:23 AM End time: 03/22/2023 12:31 AM  Staffing Anesthesiologist: Lenard Simmer, MD Performed: anesthesiologist   Preanesthetic Checklist Completed: patient identified, IV checked, site marked, risks and benefits discussed, surgical consent, monitors and equipment checked, pre-op evaluation and timeout performed  Epidural Patient position: sitting Prep: ChloraPrep Patient monitoring: heart rate, continuous pulse ox and blood pressure Approach: midline Location: L3-L4 Injection technique: LOR saline  Needle:  Needle type: Tuohy  Needle gauge: 17 G Needle length: 9 cm Needle insertion depth: 7 cm Catheter type: closed end flexible Catheter size: 19 Gauge Catheter at skin depth: 12 cm Test dose: negative and 1.5% lidocaine with Epi 1:200 K  Assessment Sensory level: T10 Events: blood not aspirated, no cerebrospinal fluid, injection not painful, no injection resistance, no paresthesia and negative IV test  Additional Notes 1st attempt Pt. Evaluated and documentation done after procedure finished. Patient identified. Risks/Benefits/Options discussed with patient including but not limited to bleeding, infection, nerve damage, paralysis, failed block, incomplete pain control, headache, blood pressure changes, nausea, vomiting, reactions to medication both or allergic, itching and postpartum back pain. Confirmed with bedside nurse the patient's most recent platelet count. Confirmed with patient that they are not currently taking any anticoagulation, have any bleeding history or any family history of bleeding disorders. Patient expressed understanding and wished to proceed. All questions were answered. Sterile technique was used throughout the entire procedure. Please see nursing notes for vital signs. Test dose was given through epidural catheter and negative prior to continuing to dose epidural or start infusion.  Warning signs of high block given to the patient including shortness of breath, tingling/numbness in hands, complete motor block, or any concerning symptoms with instructions to call for help. Patient was given instructions on fall risk and not to get out of bed. All questions and concerns addressed with instructions to call with any issues or inadequate analgesia.    Patient tolerated the insertion well without immediate complications.Reason for block:procedure for pain

## 2023-03-22 NOTE — Discharge Instructions (Signed)
 Vaginal Delivery, Care After Refer to this sheet in the next few weeks. These discharge instructions provide you with information on caring for yourself after delivery. Your caregiver may also give you specific instructions. Your treatment has been planned according to the most current medical practices available, but problems sometimes occur. Call your caregiver if you have any problems or questions after you go home. HOME CARE INSTRUCTIONS Take over-the-counter or prescription medicines only as directed by your caregiver or pharmacist. Do not drink alcohol, especially if you are breastfeeding or taking medicine to relieve pain. Do not smoke tobacco. Continue to use good perineal care. Good perineal care includes: Wiping your perineum from back to front Keeping your perineum clean. You can do sitz baths twice a day, to help keep this area clean Do not use tampons, douche or have sex until your caregiver says it is okay. Shower only and avoid sitting in submerged water, aside from sitz baths Wear a well-fitting bra that provides breast support. Eat healthy foods. Drink enough fluids to keep your urine clear or pale yellow. Eat high-fiber foods such as whole grain cereals and breads, brown rice, beans, and fresh fruits and vegetables every day. These foods may help prevent or relieve constipation. Avoid constipation with high fiber foods or medications, such as miralax or metamucil Follow your caregiver's recommendations regarding resumption of activities such as climbing stairs, driving, lifting, exercising, or traveling. Talk to your caregiver about resuming sexual activities. Resumption of sexual activities is dependent upon your risk of infection, your rate of healing, and your comfort and desire to resume sexual activity. Try to have someone help you with your household activities and your newborn for at least a few days after you leave the hospital. Rest as much as possible. Try to rest or  take a nap when your newborn is sleeping. Increase your activities gradually. Keep all of your scheduled postpartum appointments. It is very important to keep your scheduled follow-up appointments. At these appointments, your caregiver will be checking to make sure that you are healing physically and emotionally. SEEK MEDICAL CARE IF:  You are passing large clots from your vagina. Save any clots to show your caregiver. You have a foul smelling discharge from your vagina. You have trouble urinating. You are urinating frequently. You have pain when you urinate. You have a change in your bowel movements. You have increasing redness, pain, or swelling near your vaginal incision (episiotomy) or vaginal tear. You have pus draining from your episiotomy or vaginal tear. Your episiotomy or vaginal tear is separating. You have painful, hard, or reddened breasts. You have a severe headache. You have blurred vision or see spots. You feel sad or depressed. You have thoughts of hurting yourself or your newborn. You have questions about your care, the care of your newborn, or medicines. You are dizzy or light-headed. You have a rash. You have nausea or vomiting. You were breastfeeding and have not had a menstrual period within 12 weeks after you stopped breastfeeding. You are not breastfeeding and have not had a menstrual period by the 12th week after delivery. You have a fever. SEEK IMMEDIATE MEDICAL CARE IF:  You have persistent pain. You have chest pain. You have shortness of breath. You faint. You have leg pain. You have stomach pain. Your vaginal bleeding saturates two or more sanitary pads in 1 hour. MAKE SURE YOU:  Understand these instructions. Will watch your condition. Will get help right away if you are not doing well or  get worse. Document Released: 12/27/1999 Document Revised: 05/15/2013 Document Reviewed: 08/26/2011 [facility] Patient Information 2015 [place], [place]. This  information is not intended to replace advice given to you by your health care provider. Make sure you discuss any questions you have with your health care provider.  Sitz Bath A sitz bath is a warm water bath taken in the sitting position. The water covers only the hips and butt (buttocks). We recommend using one that fits in the toilet, to help with ease of use and cleanliness. It may be used for either healing or cleaning purposes. Sitz baths are also used to relieve pain, itching, or muscle tightening (spasms). The water may contain medicine. Moist heat will help you heal and relax.  HOME CARE  Take 3 to 4 sitz baths a day. Fill the bathtub half-full with warm water. Sit in the water and open the drain a little. Turn on the warm water to keep the tub half-full. Keep the water running constantly. Soak in the water for 15 to 20 minutes. After the sitz bath, pat the affected area dry. GET HELP RIGHT AWAY IF: You get worse instead of better. Stop the sitz baths if you get worse. MAKE SURE YOU: Understand these instructions. Will watch your condition. Will get help right away if you are not doing well or get worse. Document Released: 02/06/2004 Document Revised: 09/23/2011 Document Reviewed: 04/28/2010 [facility] Patient Information 2015 [place], [place]. This information is not intended to replace advice given to you by your health care provider. Make sure you discuss any questions you have with your health care provider.

## 2023-03-23 LAB — CBC
HCT: 25.7 % — ABNORMAL LOW (ref 36.0–46.0)
Hemoglobin: 8.8 g/dL — ABNORMAL LOW (ref 12.0–15.0)
MCH: 27.2 pg (ref 26.0–34.0)
MCHC: 34.2 g/dL (ref 30.0–36.0)
MCV: 79.6 fL — ABNORMAL LOW (ref 80.0–100.0)
Platelets: 202 10*3/uL (ref 150–400)
RBC: 3.23 MIL/uL — ABNORMAL LOW (ref 3.87–5.11)
RDW: 14.6 % (ref 11.5–15.5)
WBC: 9.9 10*3/uL (ref 4.0–10.5)
nRBC: 0 % (ref 0.0–0.2)

## 2023-03-23 MED ORDER — IBUPROFEN 600 MG PO TABS: 600.0000 mg | ORAL_TABLET | Freq: Four times a day (QID) | ORAL | 0 refills | Status: AC | PRN

## 2023-03-23 MED ORDER — BENZOCAINE-MENTHOL 20-0.5 % EX AERO: 1.0000 | INHALATION_SPRAY | CUTANEOUS | Status: AC | PRN

## 2023-03-23 MED ORDER — COCONUT OIL OIL: 1.0000 | TOPICAL_OIL | Status: AC | PRN

## 2023-03-23 MED ORDER — WITCH HAZEL-GLYCERIN EX PADS: 1.0000 | MEDICATED_PAD | CUTANEOUS | Status: AC | PRN

## 2023-03-23 MED ORDER — SENNOSIDES-DOCUSATE SODIUM 8.6-50 MG PO TABS
2.0000 | ORAL_TABLET | Freq: Every day | ORAL | Status: AC
Start: 2023-03-24 — End: ?

## 2023-03-23 MED ORDER — FERROUS SULFATE 325 (65 FE) MG PO TABS
325.0000 mg | ORAL_TABLET | Freq: Every day | ORAL | 0 refills | Status: AC
Start: 2023-03-23 — End: 2023-05-04

## 2023-03-23 MED ORDER — DIBUCAINE (PERIANAL) 1 % EX OINT
1.0000 | TOPICAL_OINTMENT | CUTANEOUS | Status: AC | PRN
Start: 2023-03-23 — End: ?

## 2023-03-23 MED ORDER — PRENATAL MULTIVITAMIN CH
1.0000 | ORAL_TABLET | Freq: Every day | ORAL | Status: AC
Start: 2023-03-23 — End: ?

## 2023-03-23 MED ORDER — ACETAMINOPHEN 325 MG RE SUPP: 325.0000 mg | RECTAL | Status: DC | PRN

## 2023-03-23 MED ORDER — ACETAMINOPHEN 325 MG RE SUPP: 325.0000 mg | RECTAL | Status: AC | PRN

## 2023-03-23 NOTE — Progress Notes (Signed)
 Patient discharged home with family. Discharge instructions, when to follow up, and prescriptions reviewed with patient. Patient verbalized understanding. Patient will be escorted out by auxiliary.

## 2023-04-19 ENCOUNTER — Encounter: Payer: Self-pay | Admitting: Emergency Medicine

## 2023-04-19 ENCOUNTER — Ambulatory Visit
Admission: EM | Admit: 2023-04-19 | Discharge: 2023-04-19 | Disposition: A | Discharge status: Home / Self Care | Attending: Emergency Medicine | Admitting: Emergency Medicine

## 2023-04-19 ENCOUNTER — Other Ambulatory Visit: Payer: Self-pay

## 2023-04-19 DIAGNOSIS — U071 COVID-19: Secondary | ICD-10-CM | POA: Diagnosis not present

## 2023-04-19 LAB — POC COVID19/FLU A&B COMBO
Covid Antigen, POC: POSITIVE — AB
Influenza A Antigen, POC: NEGATIVE
Influenza B Antigen, POC: NEGATIVE

## 2023-04-19 NOTE — ED Provider Notes (Signed)
 Gabriela Davidson    CSN: [id number] Arrival date & time: 04/19/23  0804      History   Chief Complaint Chief Complaint  Patient presents with   Fever    HPI Gabriela Davidson is a 28 y.o. female.   Patient presents for evaluation of a fever peaking at 100.2, nasal congestion, mild nonproductive cough, intermittent headaches and left-sided ear fullness present for 2 days.  Has also noticed mild tenderness only to the left breast but denies erythema, drainage or swelling.  Currently breast-feeding.  Has attempted use of ibuprofen.  Tolerating food and liquids.  Denies shortness of breath, wheezing, nausea vomiting or diarrhea.  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 Authorizing Provider  acetaminophen (TYLENOL) 325 MG suppository Place 1 suppository (325 mg total) rectally every 4 (four) hours as needed. 03/23/23 04/22/23  Liboon, Jazmine, CNM  benzocaine-Menthol (DERMOPLAST) 20-0.5 % AERO Apply 1  Application topically as needed for irritation (perineal discomfort). 03/23/23   Liboon, Jazmine, CNM  coconut oil OIL Apply 1 Application topically as needed. 03/23/23   Liboon, Jazmine, CNM  dibucaine (NUPERCAINAL) 1 % OINT Place 1 Application rectally as needed for hemorrhoids. 03/23/23   Liboon, Jazmine, CNM  ferrous sulfate 325 (65 FE) MG tablet Take 1 tablet (325 mg total) by mouth daily. 03/23/23 05/04/23  Liboon, Jazmine, CNM  ibuprofen (ADVIL) 600 MG tablet Take 1 tablet (600 mg total) by mouth every 6 (six) hours as needed. 03/23/23   Liboon, Jazmine, CNM  Prenatal Vit-Fe Fumarate-FA (PRENATAL MULTIVITAMIN) TABS tablet Take 1 tablet by mouth daily at 12 noon. 03/23/23   Liboon, Jazmine, CNM  senna-docusate (SENOKOT-S) 8.6-50 MG tablet Take 2 tablets by mouth daily. 03/24/23   Liboon, Jazmine, CNM  witch hazel-glycerin (TUCKS) pad Apply 1 Application topically as needed for hemorrhoids. 03/23/23   Liboon, Jazmine, CNM    Family History Family History  Problem Relation Age of Onset   Healthy Mother    Hypertension Father    Cancer Maternal Grandmother     Social History Social History   Tobacco Use   Smoking status: Never   Smokeless tobacco: Never  Vaping Use   Vaping status: Never Used  Substance Use Topics   Alcohol use: Not Currently   Drug use: Never     Allergies   Patient has no known allergies.   Review of Systems Review of Systems   Physical Exam Triage Vital Signs ED Triage Vitals [04/19/23 0815]  Encounter Vitals Group     BP 125/84     Systolic BP Percentile      Diastolic BP Percentile      Pulse Rate 82     Resp 16     Temp 98.7 F (37.1 C)     Temp Source Oral     SpO2 98 %     Weight      Height      Head Circumference      Peak Flow      Pain Score      Pain Loc      Pain Education      Exclude from Growth Chart    No data found.  Updated Vital Signs BP 125/84 (BP Location: Left Arm)   Pulse 82   Temp 98.7 F (37.1 C) (Oral)   Resp  16   LMP 06/20/2022   SpO2 98%   Breastfeeding Yes   Visual Acuity Right Eye Distance:   Left Eye Distance:   Bilateral Distance:    Right Eye Near:   Left Eye Near:    Bilateral Near:     Physical Exam Constitutional:      Appearance: Normal appearance.  HENT:     Head: Normocephalic.     Right Ear: Tympanic membrane, ear canal and external ear normal.     Left Ear: Tympanic membrane, ear canal and external ear normal.     Nose: Congestion present.     Mouth/Throat:     Pharynx: No oropharyngeal exudate or posterior oropharyngeal erythema.  Eyes:     Extraocular Movements: Extraocular movements intact.  Cardiovascular:     Rate and Rhythm: Normal rate and regular rhythm.     Pulses: Normal pulses.     Heart sounds: Normal heart sounds.  Pulmonary:     Effort: Pulmonary effort is normal.     Breath sounds: Normal breath sounds.  Chest:     Comments: Has tenderness to the left breast without swelling, erythema, no abnormality to the nipple, no dimpling noted Musculoskeletal:     Cervical back: Normal range of motion and neck supple.  Neurological:     Mental Status: She is alert and oriented to person, place, and time. Mental status is at baseline.      UC Treatments / Results  Labs (all labs ordered are listed, but only abnormal results are displayed) Labs Reviewed  POC COVID19/FLU A&B COMBO - Abnormal; Notable for the following components:      Result Value   Covid Antigen, POC Positive (*)    All other components within normal limits    EKG   Radiology No results found.  Procedures Procedures (including critical care time)  Medications Ordered in UC Medications - No data to display  Initial Impression / Assessment and Plan / UC Course  I have reviewed the triage vital signs and the nursing notes.  Pertinent labs & imaging results that were available during my care of the patient were reviewed by me and considered in my medical decision making (see  chart for details).  COVID-19  Patient is in no signs of distress nor toxic appearing.  Vital signs are stable.  Low suspicion for pneumonia,  pneumothorax or bronchitis and therefore will defer imaging. reviewed quarantine guidelines per CDC recommendations.  No signs of mastitis however advised patient to monitor and to return if new symptoms occur. May use additional over-the-counter medications as needed for supportive care.  May follow-up with urgent care as needed if symptoms persist or worsen.     Final Clinical Impressions(s) / UC Diagnoses   Final diagnoses:  COVID-19     Discharge Instructions      Covid 19 is a virus and should steadily improve in time it can take up to 7 to 10 days before you truly start to see a turnaround however things will get better  You will need to quarantine until you go 24 hours without a fever, once no fever may continue activity wearing mask  At this time the breast does not appear to be infected however if you start to experience increasing pain, redness, swelling or changes with drainage/breastmilk please return to clinic for follow-up    You can take Tylenol and/or Ibuprofen as needed for fever reduction and pain relief.   For cough: honey 1/2 to 1 teaspoon (you can dilute the honey in water or another fluid).  You can also use guaifenesin and dextromethorphan for cough. You can use a humidifier for chest congestion and cough.  If you don't have a humidifier, you can sit in the bathroom with the hot shower running.      For sore throat: try warm salt water gargles, cepacol lozenges, throat spray, warm tea or water with lemon/honey, popsicles or ice, or OTC cold relief medicine for throat discomfort.   For congestion: take a daily anti-histamine like Zyrtec, Claritin, and a oral decongestant, such as pseudoephedrine.  You can also use Flonase 1-2 sprays in each nostril daily.   It is important to stay hydrated: drink plenty of fluids (water,  gatorade/powerade/pedialyte, juices, or teas) to keep your throat moisturized and help further relieve irritation/discomfort.    ED Prescriptions   None    PDMP not reviewed this encounter.   Valinda Hoar, [place] 04/19/23 [phone]

## 2023-04-19 NOTE — Discharge Instructions (Signed)
 Covid 19 is a virus and should steadily improve in time it can take up to 7 to 10 days before you truly start to see a turnaround however things will get better  You will need to quarantine until you go 24 hours without a fever, once no fever may continue activity wearing mask  At this time the breast does not appear to be infected however if you start to experience increasing pain, redness, swelling or changes with drainage/breastmilk please return to clinic for follow-up    You can take Tylenol and/or Ibuprofen as needed for fever reduction and pain relief.   For cough: honey 1/2 to 1 teaspoon (you can dilute the honey in water or another fluid).  You can also use guaifenesin and dextromethorphan for cough. You can use a humidifier for chest congestion and cough.  If you don't have a humidifier, you can sit in the bathroom with the hot shower running.      For sore throat: try warm salt water gargles, cepacol lozenges, throat spray, warm tea or water with lemon/honey, popsicles or ice, or OTC cold relief medicine for throat discomfort.   For congestion: take a daily anti-histamine like Zyrtec, Claritin, and a oral decongestant, such as pseudoephedrine.  You can also use Flonase 1-2 sprays in each nostril daily.   It is important to stay hydrated: drink plenty of fluids (water, gatorade/powerade/pedialyte, juices, or teas) to keep your throat moisturized and help further relieve irritation/discomfort.

## 2023-04-19 NOTE — ED Triage Notes (Signed)
 Patient presents to [facility] for evaluation of fever since Saturday.  Denies any other major associated URI type symptoms.  She is currently breastfeeding and is having some mild left breast soreness.  Has been taking Ibuprofen and feels like the fever broke last night

## 2023-05-01 NOTE — Anesthesia Postprocedure Evaluation (Addendum)
 Anesthesia Post Note  Patient: Gabriela Davidson  Procedure(s) Performed: AN AD HOC LABOR EPIDURAL  Anesthesia Type: Epidural Pain management: pain level controlled Vital Signs Assessment: post-procedure vital signs reviewed and stable Respiratory status: respiratory function stable Cardiovascular status: stable Postop Assessment: no headache, no backache and able to ambulate Anesthetic complications: no Comments: Patient was discharged prior to being seen by the anesthesia post-op team.  Pain was well controlled, she was ambulating well, and had no complaints prior to discharge per the notes.   No notable events documented.   Last Vitals: There were no vitals filed for this visit.  Last Pain: There were no vitals filed for this visit.               Vanice Genre

## 2023-05-13 ENCOUNTER — Ambulatory Visit
Admission: RE | Admit: 2023-05-13 | Discharge: 2023-05-13 | Disposition: A | Discharge status: Home / Self Care | Source: Ambulatory Visit | Attending: Family Medicine | Admitting: Family Medicine

## 2023-05-13 ENCOUNTER — Other Ambulatory Visit: Payer: Self-pay

## 2023-05-13 VITALS — BP 110/78 | HR 90 | Temp 99.1°F | Resp 18

## 2023-05-13 DIAGNOSIS — J40 Bronchitis, not specified as acute or chronic: Secondary | ICD-10-CM

## 2023-05-13 DIAGNOSIS — H6993 Unspecified Eustachian tube disorder, bilateral: Secondary | ICD-10-CM

## 2023-05-13 DIAGNOSIS — J329 Chronic sinusitis, unspecified: Secondary | ICD-10-CM

## 2023-05-13 MED ORDER — PREDNISONE 10 MG PO TABS: 10.0000 mg | ORAL_TABLET | Freq: Two times a day (BID) | ORAL | 0 refills | Status: AC

## 2023-05-13 NOTE — ED Triage Notes (Signed)
 Pt reports sinus issues ,bil ear fullness and cough.

## 2023-05-13 NOTE — ED Triage Notes (Signed)
 PT DOB and Full Name confirmed

## 2023-05-13 NOTE — Discharge Instructions (Signed)
 Prednisone  10 mg twice daily for 5 days to help with the sinus pressure that is causing you to have the ear symptoms.  Continue taking your allergy medication.  Continue saline to help reduce the congestion.  If symptoms worsen or do not improve with treatment return for reevaluation or follow-up with primary doctor.

## 2023-05-13 NOTE — ED Provider Notes (Signed)
 [facility]    CSN: [id number] Arrival date & time: 05/13/23  1429      History   Chief Complaint Chief Complaint  Patient presents with   Ear Fullness    Slight cough, sinus tightness, ear pain. No fever. - Entered by patient   Cough    HPI Gabriela Davidson is a 28 y.o. female.  Patient here fore evaluation of worsening cough, sinus pressure, headache ear pain and increased work of breathing. Patient has no known history of asthma although has a history of allergic rhinitis.  Symptom have progressively worsened over the last 3 days she has had some runny nose and congestion symptoms on and off for several weeks.  She has attempted relief with OTC medication without improvement.  Endorses some bilateral inner ear fullness and discomfort.  To her knowledge she has not had fever.   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 Authorizing Provider  predniSONE  (DELTASONE ) 10 MG tablet Take 1  tablet (10 mg total) by mouth 2 (two) times daily with a meal for 5 days. 05/13/23 05/18/23 Yes Buena Carmine, NP  benzocaine -Menthol  (DERMOPLAST) 20-0.5 % AERO Apply 1 Application topically as needed for irritation (perineal discomfort). 03/23/23   Liboon, Jazmine, CNM  coconut oil OIL Apply 1 Application topically as needed. 03/23/23   Liboon, Jazmine, CNM  dibucaine (NUPERCAINAL) 1 % OINT Place 1 Application rectally as needed for hemorrhoids. 03/23/23   Liboon, Jazmine, CNM  ferrous sulfate  325 (65 FE) MG tablet Take 1 tablet (325 mg total) by mouth daily. 03/23/23 05/04/23  Liboon, Jazmine, CNM  ibuprofen  (ADVIL ) 600 MG tablet Take 1 tablet (600 mg total) by mouth every 6 (six) hours as needed. 03/23/23   Liboon, Jazmine, CNM  Prenatal Vit-Fe Fumarate-FA (PRENATAL MULTIVITAMIN) TABS tablet Take 1 tablet by mouth daily at 12 noon. 03/23/23   Liboon, Jazmine, CNM  senna-docusate (SENOKOT-S) 8.6-50 MG tablet Take 2 tablets by mouth daily. 03/24/23   Liboon, Jazmine, CNM  witch hazel-glycerin  (TUCKS) pad Apply 1 Application topically as needed for hemorrhoids. 03/23/23   Liboon, Jazmine, CNM    Family History Family History  Problem Relation Age of Onset   Healthy  Mother    Hypertension Father    Cancer Maternal Grandmother     Social History Social History   Tobacco Use   Smoking status: Never   Smokeless tobacco: Never  Vaping Use   Vaping status: Never Used  Substance Use Topics   Alcohol use: Not Currently   Drug use: Never     Allergies   Patient has no known allergies.   Review of Systems Review of Systems  Respiratory:  Positive for cough.      Physical Exam Triage Vital Signs ED Triage Vitals [05/13/23 1457]  Encounter Vitals Group     BP      Systolic BP Percentile      Diastolic BP Percentile      Pulse      Resp      Temp      Temp src      SpO2      Weight      Height      Head Circumference      Peak Flow      Pain Score 0     Pain Loc      Pain  Education      Exclude from Growth Chart    No data found.  Updated Vital Signs BP 110/78   Pulse 90   Temp 99.1 F (37.3 C)   Resp 18   LMP 06/20/2022   SpO2 97%   Breastfeeding Yes   Visual Acuity Right Eye Distance:   Left Eye Distance:   Bilateral Distance:    Right Eye Near:   Left Eye Near:    Bilateral Near:     Physical Exam Vitals reviewed.  Constitutional:      Appearance: Normal appearance. She is ill-appearing.  HENT:     Head: Normocephalic and atraumatic.     Right Ear: No middle ear effusion. Tympanic membrane is not erythematous, retracted or bulging.     Left Ear:  No middle ear effusion. Tympanic membrane is not erythematous, retracted or bulging.     Nose: Mucosal edema, congestion and rhinorrhea present.     Right Turbinates: Enlarged.     Left Turbinates: Enlarged.     Mouth/Throat:     Pharynx: Postnasal drip present.  Cardiovascular:     Rate and Rhythm: Normal rate and regular rhythm.  Pulmonary:     Effort: Pulmonary effort is normal.     Breath sounds: Normal breath sounds.  Musculoskeletal:        General: Normal range of motion.     Cervical back: Normal range of motion and neck supple.  Lymphadenopathy:     Cervical: No cervical adenopathy.  Skin:    General: Skin is warm and dry.  Neurological:     General: No focal deficit present.     Mental Status: She is alert.      UC Treatments / Results  Labs (all labs ordered are listed, but only abnormal results are displayed) Labs Reviewed - No data to display  EKG   Radiology No results found.  Procedures Procedures (including critical care time)  Medications Ordered in UC Medications - No data to display  Initial Impression / Assessment and Plan / UC Course  I have reviewed the triage vital signs and the nursing notes.  Pertinent labs & imaging results that were available during my care of the patient were reviewed by me and considered in my medical decision making  (see chart for details).  Eustachian tube dysfunction bilaterally with sinobronchitis, viral, treatment with prednisone  10 mg twice daily for 5 days.  Recommend continuing saline rinse to help with congestion.  Continue taking over-the-counter allergy medication.  Return precautions given if symptoms worsen or do not improve with treatment. Final Clinical Impressions(s) / UC Diagnoses   Final diagnoses:  Sinobronchitis  Eustachian tube dysfunction, bilateral     Discharge Instructions      Prednisone  10 mg twice daily for 5 days to help with the sinus pressure that is causing you to have the ear symptoms.  Continue taking your allergy medication.  Continue saline to help reduce the congestion.  If symptoms worsen or do not improve with treatment return for reevaluation or follow-up with primary doctor.     ED Prescriptions     Medication Sig Dispense Auth. Provider   predniSONE  (DELTASONE ) 10 MG tablet Take 1 tablet (10 mg total) by mouth 2 (two) times daily with a meal for 5 days. 10 tablet Buena Carmine, NP      PDMP not reviewed this encounter.   Buena Carmine, NP 05/17/23 1430

## 2023-08-18 ENCOUNTER — Encounter: Payer: Self-pay | Admitting: Obstetrics and Gynecology

## 2023-09-13 ENCOUNTER — Ambulatory Visit: Admission: EM | Admit: 2023-09-13 | Discharge: 2023-09-13 | Disposition: A | Discharge status: Home / Self Care

## 2023-09-13 DIAGNOSIS — J069 Acute upper respiratory infection, unspecified: Secondary | ICD-10-CM

## 2023-09-13 NOTE — ED Triage Notes (Signed)
 Patient to Urgent Care with complaints of nasal congestion/ dry and productive cough/ drainage/ ear fullness. Denies any fevers.   Symptoms x3 days.  Meds: taking dayquil/ nyquil.

## 2023-09-13 NOTE — Discharge Instructions (Signed)
 Your COVID test is negative.  Follow-up with your primary care provider if your symptoms are not improving.

## 2023-09-13 NOTE — ED Provider Notes (Signed)
 [name]    CSN: [id number] Arrival date & time: 09/13/23  0820      History   Chief Complaint Chief Complaint  Patient presents with   Nasal Congestion   Cough    HPI Gabriela Davidson is a 28 y.o. female.  Patient presents with 3-day history of runny nose, congestion, postnasal drip, ear fullness, cough.  No fever, shortness of breath, vomiting, diarrhea.  Patient has been taking DayQuil and NyQuil.  She is a lactating mother and has been discarding her breastmilk while taking these medications.  The history is provided by the patient and medical records.    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 Authorizing Provider  spironolactone (ALDACTONE) 50 MG tablet Take 50 mg by mouth 2 (two) times daily. 07/23/23  Yes [provider]  tretinoin (RETIN-A) 0.025 % cream SMARTSIG:sparingly Topical Every Night 07/23/23  Yes [provider]  benzocaine -Menthol  (DERMOPLAST)  20-0.5 % AERO Apply 1 Application topically as needed for irritation (perineal discomfort). 03/23/23   Liboon, Jazmine, CNM  coconut oil OIL Apply 1 Application topically as needed. 03/23/23   Liboon, Jazmine, CNM  dibucaine (NUPERCAINAL) 1 % OINT Place 1 Application rectally as needed for hemorrhoids. 03/23/23   Liboon, Jazmine, CNM  ferrous sulfate  325 (65 FE) MG tablet Take 1 tablet (325 mg total) by mouth daily. 03/23/23 05/04/23  Liboon, Jazmine, CNM  ibuprofen  (ADVIL ) 600 MG tablet Take 1 tablet (600 mg total) by mouth every 6 (six) hours as needed. 03/23/23   Liboon, Jazmine, CNM  Prenatal Vit-Fe Fumarate-FA (PRENATAL MULTIVITAMIN) TABS tablet Take 1 tablet by mouth daily at 12 noon. 03/23/23   Liboon, Jazmine, CNM  senna-docusate (SENOKOT-S) 8.6-50 MG tablet Take 2 tablets by mouth daily. 03/24/23   Liboon, Jazmine, CNM  witch hazel-glycerin  (TUCKS) pad Apply 1 Application topically as needed for hemorrhoids. 03/23/23   Liboon, Jazmine, CNM    Family History Family History  Problem Relation Age of Onset   Healthy Mother    Hypertension Father    Cancer Maternal Grandmother     Social History Social History   Tobacco Use   Smoking status: Never   Smokeless tobacco: Never  Vaping Use   Vaping status: Never Used  Substance Use Topics   Alcohol use: Not Currently   Drug use: Never     Allergies   Patient has no known allergies.   Review of Systems Review of Systems  Constitutional:  Negative for chills and fever.  HENT:  Positive for congestion, ear pain, postnasal drip and rhinorrhea. Negative for sore throat.   Respiratory:  Positive for cough. Negative for shortness of breath.   Gastrointestinal:  Negative for diarrhea and vomiting.     Physical Exam Triage Vital Signs ED Triage Vitals  Encounter Vitals Group     BP 09/13/23 0919 126/80     Girls Systolic BP Percentile --      Girls Diastolic BP Percentile --      Boys Systolic BP Percentile --      Boys Diastolic  BP Percentile --      Pulse Rate 09/13/23 0919 76     Resp 09/13/23 0919 18     Temp 09/13/23 0919 98 F (36.7 C)     Temp src --      SpO2 09/13/23 0919 99 %     Weight --      Height --      Head Circumference --      Peak Flow --      Pain Score 09/13/23 0916 0     Pain Loc --      Pain Education --      Exclude from Growth Chart --    No data found.  Updated Vital Signs BP 126/80   Pulse 76   Temp 98 F (36.7 C)   Resp 18   LMP 08/31/2023   SpO2 99%   Breastfeeding Yes   Visual Acuity Right Eye Distance:   Left Eye Distance:   Bilateral Distance:    Right Eye Near:   Left Eye Near:    Bilateral Near:     Physical Exam Constitutional:      General: She is not in acute distress. HENT:     Right Ear: Tympanic membrane normal.     Left Ear: Tympanic membrane normal.     Nose: Congestion and rhinorrhea present.     Mouth/Throat:     Mouth: Mucous membranes are moist.     Pharynx: Oropharynx is clear.  Cardiovascular:     Rate and Rhythm: Normal rate and regular rhythm.     Heart sounds: Normal heart sounds.  Pulmonary:     Effort: Pulmonary effort is normal. No respiratory distress.     Breath sounds: Normal breath sounds.  Neurological:     Mental Status: She is alert.      UC Treatments / Results  Labs (all labs ordered are listed, but only abnormal results are displayed) Labs Reviewed  POC SOFIA SARS ANTIGEN FIA    EKG   Radiology No results found.  Procedures Procedures (including critical care time)  Medications Ordered in UC Medications - No data to display  Initial Impression / Assessment and Plan / UC Course  I have reviewed the triage vital signs and the nursing notes.  Pertinent labs & imaging results that were available during my care of the patient were reviewed by me and considered in my medical decision making (see chart for details).    Viral URI, lactating mother.  Afebrile and vital signs are stable.  Lungs are clear  and O2 sat is 99% on room air.  Rapid COVID test  is negative.  Discussed symptomatic management as it relates to her breast-feeding.  Instructed her to continue discarding her breastmilk if she is taking OTC medications that could be detrimental for her infant.  Education provided on viral respiratory infection.  Instructed her to follow-up with her PCP if her symptoms are not improving.  She agrees to plan of care.  Final Clinical Impressions(s) / UC Diagnoses   Final diagnoses:  Viral URI  Lactating mother     Discharge Instructions      Your COVID test is negative.  Follow-up with your primary care provider if your symptoms are not improving.     ED Prescriptions   None    PDMP not reviewed this encounter.   Corlis Burnard DEL, NP 09/13/23 [phone]
